# Patient Record
Sex: Female | Born: 1970
Health system: Southern US, Community
[De-identification: ages and names within clinical notes are randomized; demographics above are authoritative.]

## PROBLEM LIST (undated history)

## (undated) DIAGNOSIS — H9190 Unspecified hearing loss, unspecified ear: Secondary | ICD-10-CM

## (undated) DIAGNOSIS — Z975 Presence of (intrauterine) contraceptive device: Secondary | ICD-10-CM

## (undated) DIAGNOSIS — T8859XA Other complications of anesthesia, initial encounter: Secondary | ICD-10-CM

## (undated) DIAGNOSIS — K219 Gastro-esophageal reflux disease without esophagitis: Secondary | ICD-10-CM

## (undated) DIAGNOSIS — G5602 Carpal tunnel syndrome, left upper limb: Secondary | ICD-10-CM

## (undated) DIAGNOSIS — Z8639 Personal history of other endocrine, nutritional and metabolic disease: Secondary | ICD-10-CM

## (undated) DIAGNOSIS — T4145XA Adverse effect of unspecified anesthetic, initial encounter: Secondary | ICD-10-CM

## (undated) DIAGNOSIS — F411 Generalized anxiety disorder: Secondary | ICD-10-CM

## (undated) DIAGNOSIS — L709 Acne, unspecified: Secondary | ICD-10-CM

## (undated) DIAGNOSIS — Z8679 Personal history of other diseases of the circulatory system: Secondary | ICD-10-CM

## (undated) HISTORY — DX: Personal history of other diseases of the circulatory system: Z86.79

## (undated) HISTORY — DX: Carpal tunnel syndrome, left upper limb: G56.02

## (undated) HISTORY — DX: Generalized anxiety disorder: F41.1

## (undated) HISTORY — DX: Acne, unspecified: L70.9

## (undated) HISTORY — DX: Personal history of other endocrine, nutritional and metabolic disease: Z86.39

## (undated) HISTORY — DX: Presence of (intrauterine) contraceptive device: Z97.5

## (undated) HISTORY — DX: Gastro-esophageal reflux disease without esophagitis: K21.9

## (undated) HISTORY — PX: MANDIBLE SURGERY: SHX707

## (undated) HISTORY — DX: Unspecified hearing loss, unspecified ear: H91.90

---

## 1998-08-04 ENCOUNTER — Ambulatory Visit (HOSPITAL_BASED_OUTPATIENT_CLINIC_OR_DEPARTMENT_OTHER): Admission: RE | Admit: 1998-08-04 | Discharge: 1998-08-04 | Payer: Self-pay | Admitting: Otolaryngology

## 1998-09-29 ENCOUNTER — Other Ambulatory Visit: Admission: RE | Admit: 1998-09-29 | Discharge: 1998-09-29 | Payer: Self-pay | Admitting: Obstetrics and Gynecology

## 1999-08-03 ENCOUNTER — Ambulatory Visit (HOSPITAL_BASED_OUTPATIENT_CLINIC_OR_DEPARTMENT_OTHER): Admission: RE | Admit: 1999-08-03 | Discharge: 1999-08-03 | Payer: Self-pay | Admitting: Otolaryngology

## 1999-09-19 ENCOUNTER — Other Ambulatory Visit: Admission: RE | Admit: 1999-09-19 | Discharge: 1999-09-19 | Payer: Self-pay | Admitting: Obstetrics and Gynecology

## 2000-05-22 ENCOUNTER — Other Ambulatory Visit: Admission: RE | Admit: 2000-05-22 | Discharge: 2000-05-22 | Payer: Self-pay | Admitting: Obstetrics and Gynecology

## 2000-12-13 ENCOUNTER — Inpatient Hospital Stay (HOSPITAL_COMMUNITY): Admission: AD | Admit: 2000-12-13 | Discharge: 2000-12-16 | Payer: Self-pay | Admitting: *Deleted

## 2000-12-17 ENCOUNTER — Encounter: Admission: RE | Admit: 2000-12-17 | Discharge: 2001-03-17 | Payer: Self-pay | Admitting: Obstetrics and Gynecology

## 2001-01-15 ENCOUNTER — Other Ambulatory Visit: Admission: RE | Admit: 2001-01-15 | Discharge: 2001-01-15 | Payer: Self-pay | Admitting: Obstetrics and Gynecology

## 2001-01-22 ENCOUNTER — Inpatient Hospital Stay (HOSPITAL_COMMUNITY): Admission: AD | Admit: 2001-01-22 | Discharge: 2001-01-22 | Payer: Self-pay | Admitting: Obstetrics and Gynecology

## 2001-02-25 ENCOUNTER — Encounter: Payer: Self-pay | Admitting: Urology

## 2001-02-25 ENCOUNTER — Encounter: Admission: RE | Admit: 2001-02-25 | Discharge: 2001-02-25 | Payer: Self-pay | Admitting: Urology

## 2001-02-26 ENCOUNTER — Encounter: Admission: RE | Admit: 2001-02-26 | Discharge: 2001-02-26 | Payer: Self-pay | Admitting: Urology

## 2001-02-26 ENCOUNTER — Encounter: Payer: Self-pay | Admitting: Urology

## 2002-01-18 ENCOUNTER — Other Ambulatory Visit: Admission: RE | Admit: 2002-01-18 | Discharge: 2002-01-18 | Payer: Self-pay | Admitting: Obstetrics and Gynecology

## 2003-01-24 ENCOUNTER — Other Ambulatory Visit: Admission: RE | Admit: 2003-01-24 | Discharge: 2003-01-24 | Payer: Self-pay | Admitting: Obstetrics and Gynecology

## 2003-12-15 ENCOUNTER — Inpatient Hospital Stay (HOSPITAL_COMMUNITY): Admission: RE | Admit: 2003-12-15 | Discharge: 2003-12-17 | Payer: Self-pay | Admitting: Obstetrics and Gynecology

## 2004-01-23 ENCOUNTER — Other Ambulatory Visit: Admission: RE | Admit: 2004-01-23 | Discharge: 2004-01-23 | Payer: Self-pay | Admitting: Obstetrics and Gynecology

## 2005-03-18 ENCOUNTER — Other Ambulatory Visit: Admission: RE | Admit: 2005-03-18 | Discharge: 2005-03-18 | Payer: Self-pay | Admitting: Obstetrics and Gynecology

## 2005-06-11 ENCOUNTER — Encounter: Admission: RE | Admit: 2005-06-11 | Discharge: 2005-06-11 | Payer: Self-pay | Admitting: Obstetrics and Gynecology

## 2006-01-02 ENCOUNTER — Encounter: Admission: RE | Admit: 2006-01-02 | Discharge: 2006-01-02 | Payer: Self-pay | Admitting: Obstetrics and Gynecology

## 2006-06-17 ENCOUNTER — Encounter: Admission: RE | Admit: 2006-06-17 | Discharge: 2006-06-17 | Payer: Self-pay | Admitting: Obstetrics and Gynecology

## 2008-08-24 ENCOUNTER — Ambulatory Visit: Payer: Self-pay | Admitting: *Deleted

## 2009-08-14 ENCOUNTER — Emergency Department (HOSPITAL_COMMUNITY): Admission: EM | Admit: 2009-08-14 | Discharge: 2009-08-14 | Payer: Self-pay | Admitting: Emergency Medicine

## 2010-08-21 ENCOUNTER — Encounter: Admission: RE | Admit: 2010-08-21 | Discharge: 2010-08-21 | Payer: Self-pay | Admitting: Family Medicine

## 2010-12-16 ENCOUNTER — Encounter: Payer: Self-pay | Admitting: Obstetrics and Gynecology

## 2011-04-08 DIAGNOSIS — F411 Generalized anxiety disorder: Secondary | ICD-10-CM | POA: Insufficient documentation

## 2011-04-12 NOTE — H&P (Signed)
NAMEYVONDA, FOUTY                        ACCOUNT NO.:  [id number]   MEDICAL RECORD NO.:  [id number]                   PATIENT TYPE:   LOCATION:                                       FACILITY:  [facility]   PHYSICIAN:  Dineen Kid. Rana Snare, M.D.                 DATE OF BIRTH:  Dec 07, 1970   DATE OF ADMISSION:  12/15/2003  DATE OF DISCHARGE:                                HISTORY & PHYSICAL   HISTORY OF PRESENT ILLNESS:  Ms. Amara is a 40 year old, G3, P1, at [redacted]  weeks gestational age, who presents for repeat cesarean section.  Her  estimated date of confinement is December 25, 2003.  Her pregnancy has been  complicated by previous cesarean section with an estimated fetal weight at  36 weeks of 6 pounds 7 ounces.  Her previous surgery was significant for a 7  pound 7 ounce baby with failure to progress and arrest of descent.  Because  the weight will be very similar, she desires repeat cesarean section.  She  also does have a history of fibroids as well.  Her group B Streptococcus was  negative.   PAST MEDICAL HISTORY:  Negative.   PAST SURGICAL HISTORY:  1. Cesarean section in 2002.  2. Surgery on her ears for otosclerosis.   MEDICATIONS:  Prenatal vitamins.   DRUG ALLERGIES:  She is allergic to:  1. KEFLEX.  2. ERYTHROMYCIN.  3. TETRACYCLINE.   PHYSICAL EXAMINATION:  VITAL SIGNS:  The blood pressure is 110/60.  HEART:  Regular rate and rhythm.  LUNGS:  Clear to auscultation bilaterally.  ABDOMEN:  Gravid.  Fundal height is 37 cm.  PELVIC:  The cervix is closed, 25%, and -2 station.   IMPRESSION:  1. Intrauterine pregnancy at term.  2. Previous cesarean section for arrest of descent with a similar size baby     this pregnancy.   PLAN:  Repeat cesarean section.  All risks and benefits were discussed at  length, which include, but not limited to risk of infection, bleeding,  damage to uterus, tubes, ovaries, bowel or bladder, and injury to fetus.  She does give her informed  consent.                                               Dineen Kid Rana Snare, M.D.    DCL/MEDQ  D:  12/14/2003  T:  12/14/2003  Job:  [id number]

## 2011-04-12 NOTE — Discharge Summary (Signed)
Helen Lowe, Helen Lowe                        ACCOUNT NO.:  [id number]   MEDICAL RECORD NO.:  [id number]                   PATIENT TYPE:  INP   LOCATION:  9105                                 FACILITY:  [facility]   PHYSICIAN:  Dineen Kid. Rana Snare, M.D.                 DATE OF BIRTH:  12/25/1970   DATE OF ADMISSION:  12/15/2003  DATE OF DISCHARGE:  12/17/2003                                 DISCHARGE SUMMARY   ADMITTING DIAGNOSES:  1. Intrauterine pregnancy at 42 weeks estimated gestational age.  2. Previous cesarean section, desires repeat.   DISCHARGE DIAGNOSES:  1. Status post low transverse cesarean section.  2. Viable female infant.   PROCEDURE:  Repeat low transverse cesarean section.   REASON FOR ADMISSION:  Please see dictated H&P.   HOSPITAL COURSE:  The patient was a 40 year old gravida 3 para 1 that was  admitted to [facility] at 39 weeks estimated gestational  age.  The patient's pregnancy had been complicated by a previous cesarean  section for a 7-pound 7-ounce baby due to arrest of descent.  Ultrasound at  36 weeks had shown the current baby was weighing 6 pounds 7 ounces so she  desired a repeat cesarean and presented to the hospital for that scheduled  cesarean delivery.  The patient was then transferred to the operating room  where spinal anesthesia was administered without difficulty.  A low  transverse incision was made with the delivery of a viable female infant  weighing 6 pounds 13 ounces with Apgars of 9 at one minute, 9 at five  minutes.  Umbilical cord pH was 7.36.  The patient tolerated the procedure  well and was taken to the recovery room in stable condition.  On  postoperative day #1 the patient was without complaint; however, she was  concerned about her previous history of a urinary tract infection after her  previous cesarean delivery with subsequent year-long follow-up with  urologist.  Vital signs were stable; she was afebrile.  Abdomen  was soft and  fundus was firm and nontender  The patient had good return of bowel  function.  Abdominal dressing had been partially removed revealing an  incision that was clean, dry, and intact.  Labs revealed hemoglobin 11.3;  platelet count 167,000; wbc count of 9.7.  The patient was started on some  empiric nitrofurantoin.  On postoperative day #2 the patient was doing well.  She was tolerating a regular diet without complaints of nausea and vomiting.  She was ambulating well and desired early discharge.  Instructions were  reviewed and the patient was discharged home.   CONDITION ON DISCHARGE:  Good.   DIET:  Regular as tolerated.   ACTIVITY:  No heavy lifting, no driving x2 weeks, no vaginal entry.   FOLLOW-UP:  The patient is to follow up in the office in 2-3 days for  removal of her staples.  Otherwise,  she is return to the office in  approximately 1 week for an incision check.  She is to call for temperature  greater than 100 degrees, persistent nausea and vomiting, heavy vaginal  bleeding, and/or redness or drainage from the incisional site.   DISCHARGE MEDICATIONS:  1. Tylox #30 one p.o. q.4-6h. p.r.n. pain.  2. Motrin 800 mg q.8h.  3. Macrobid #14 one p.o. b.i.d.  4. Prenatal vitamins one p.o. daily.     Julio Sicks, N.P.                        Dineen Kid Rana Snare, M.D.    CC/MEDQ  D:  01/13/2004  T:  01/14/2004  Job:  [id number]

## 2011-04-12 NOTE — Discharge Summary (Signed)
[facility]  Patient:    Helen Lowe, Helen Lowe                     MRN: [id number] Adm. Date:  [id number] Disc. Date: [id number] Attending:  Marina Gravel B                           Discharge Summary  ADMISSION DIAGNOSES:          1. Term intrauterine pregnancy, spontaneous                                  labor.                               2. Failure to descend.  PROCEDURES:                   Primary low transverse cesarean section.  HISTORY OF PRESENT ILLNESS:   For complete details, please see the history and physical on the chart. Briefly, the patient presented to [facility] on December 13, 2000 with complaints of painful contractions for approximately two and a half hours. At admission, she was 8 cm dilated, completely effaced, and 0 station.  HOSPITAL COURSE:              The patient was admitted to labor and delivery with artificial rupture of membranes performed. The patient progressed to complete/complete, +3 and pushed for approximately 90 minutes and had complaints of exhaustion. The patient had demonstrated no descent after pushing for 90 minutes. Clinical suspicion was for macrosomia with estimated fetal weight of approximately 8 pounds by Leopolds maneuvers. Given the poor progress through labor, options were discussed to include continued pushing versus primary C-section. The patient chose primary C-section.  On December 13, 2000 the patient underwent a primary low transverse cesarean section, a viable female infant with Apgars 9 and 9, weight 7 pounds 7 ounces, delivered. No complications.  Postoperatively, the patient rapidly regained her ability to ambulate, void, and tolerate a regular diet. She was discharged to home on the third postoperative day in satisfactory condition. She was noted to be A positive and rubella immune. Her postoperative hemoglobin was 11.0.  DISCHARGE INSTRUCTIONS:       Standard preprinted  instructions were given prior to dismissal.  DISCHARGE MEDICATIONS:        Tylox one to two tabs every four to six hours p.r.n. for pain.  DISCHARGE FOLLOWUP:           The patient is to follow up in four to six weeks at [facility] for a routine postpartum check.  CONDITION ON DISCHARGE:       Satisfactory. DD:  01/08/01 TD:  01/09/01 Job: [id number]

## 2011-04-12 NOTE — Op Note (Signed)
[facility]  Patient:    Helen Lowe, Helen Lowe                     MRN: [id number] Adm. Date:  [id number] Attending:  Marina Gravel B                           Operative Report  PREOPERATIVE DIAGNOSES:       1. Term intrauterine pregnancy.                               2. Arrest of descent.                               3. Maternal exhaustion.                               4. Suspected macrosomia.  POSTOPERATIVE DIAGNOSES:      1. Term intrauterine pregnancy.                               2. Arrest of descent.                               3. Maternal exhaustion.                               4. Suspected macrosomia.  PROCEDURE:                    Primary low transverse cesarean section.  SURGEON:                      Marina Gravel, M.D.  ASSISTANT:                    Jamey Reas, M.D.  ANESTHESIA:                   Epidural.  FINDINGS:                     Viable female infant, Apgars 9 and 9, weight 7 pounds 7 ounces. Normal uterus, tubes, ovaries.  ESTIMATED BLOOD LOSS:         600 cc.  URINE OUTPUT:                 100 cc.  FLUIDS:                       1000 cc.  COMPLICATIONS:                None.  DRAINS:                       Foley.  DESCRIPTION OF PROCEDURE:     Patient was taken to the operating room with epidural anesthesia in place. She was prepped and draped in standard fashion with Foley catheter in the bladder.  A Pfannenstiel incision was made with a knife and carried sharp to the underlying fascia. The fascia was divided in the midline and the incision extended laterally with Mayo  scissors. Kocher clamps used to elevate the superior aspect of the incision and underlying rectus muscles were dissected off sharply. Repeated inferiorly in similar fashion.  The midline of the rectus muscle was identified, the underlying peritoneum elevated with hemostats, entered sharply with Metzenbaum scissors. The incision extended  superiorly and inferiorly sharply.  Bladder blade was inserted, the vesicouterine peritoneum identified and elevated, and bladder flap created with sharp and blunt technique. The bladder blade was reinserted and the uterine incision made in low transverse fashion with a knife.  The infants head was delivered, the nose and mouth suctioned with the bulb, and the remainder of the infant delivered without difficulty. The cord was clamped and cut and the infant handed off to the awaiting pediatricians. Ancef 2 g was given at cord clamp.  The placenta was removed manually, the uterus exteriorized and cleared of all clots and debris. The uterine incision was inspected and was free of extension.  Bladder blade was reinserted and the uterine incision closed in a running locked fashion with 0 Monocryl. Hemostasis obtained.  The uterus was reinserted into the abdomen and the pelvis irrigated with normal saline. The uterine incision and bladder flap were hemostatic. The subfascial space was inspected and was hemostatic.  The fascia was closed with 0 Vicryl in running stitch. The subcutaneous tissue was irrigated and made hemostatic with a Bovie. The skin was closed with staples.  The patient tolerated the procedure well and there were no complications. She was taken to the recovery room awake, alert, and in stable condition. All counts were correct per the operating room staff. DD:  12/13/00 TD:  12/13/00 Job: [id number]

## 2011-04-12 NOTE — Op Note (Signed)
Helen Lowe, Helen Lowe                        ACCOUNT NO.:  [id number]   MEDICAL RECORD NO.:  [id number]                   PATIENT TYPE:  INP   LOCATION:  9105                                 FACILITY:  [facility]   PHYSICIAN:  Dineen Kid. Rana Snare, M.D.                 DATE OF BIRTH:  Mar 28, 1971   DATE OF PROCEDURE:  12/15/2003  DATE OF DISCHARGE:                                 OPERATIVE REPORT   PREOPERATIVE DIAGNOSES:  1. Intrauterine pregnancy at 39 weeks.  2. Previous cesarean section for arrest of descent and a similar-sized baby,     so she presents for repeat cesarean section.   POSTOPERATIVE DIAGNOSES:  1. Intrauterine pregnancy at 39 weeks.  2. Previous cesarean section for arrest of descent and a similar-sized baby,     so she presents for repeat cesarean section.   PROCEDURE:  Repeat low segment transverse cesarean section.   SURGEON:  Dineen Kid. Rana Snare, M.D.   ANESTHESIA:  Spinal.   INDICATIONS:  Ms. Rexroad is a 40 year old G3, P1, at 24 weeks, with an EDC  of December 15, 2003.  The pregnancy has been complicated by previous  cesarean section for a 7 pound 7 ounce baby due to arrest of descent.  Ultrasound at 36 weeks shows a 6 pound 7 ounce baby, so she does desire  repeat cesarean section and presents for that today.  Risks and benefits  were discussed and informed consent was obtained.   Findings at the time of surgery:  A viable female infant, Apgars are 9 and  9, pH arterial 7.36, weight 6 pounds 13 ounces.   DESCRIPTION OF PROCEDURE:  After adequate analgesia, the patient placed in  the supine position with a left lateral tilt.  She is sterilely prepped and  draped, the bladder was sterilely drained with a Foley catheter.  The  previous Pfannenstiel skin incision was sharply excised.  The incision was  then taken down sharply to the fascia, incised transversely and extended  superiorly and inferiorly.  After the bellies of the rectus muscle had been  separated sharply in  the midline, the peritoneum was entered sharply, the  bladder flap was created and placed behind the bladder blade.  A low segment  myotomy incision was made down to the infant's vertex and extended laterally  with the operator's fingertips.  The infant was then delivered  atraumatically, the nose and pharynx suctioned, cord clamped and cut, and  handed to the pediatricians.  The placenta was extracted manually.  The  uterus was exteriorized, wiped clean with a dry lap.  The myotomy incision  was closed with two layers using a 0 Monocryl, the first being a running  locking layer, the second being an imbricating layer, and then a figure-of-  eight o 0 Monocryl for hemostasis.  The uterus was placed back in the  peritoneal cavity and after a copious amount of irrigation,  adequate  hemostasis was assured.  The peritoneum was closed with 0 Monocryl suture,  rectus muscle plicated in the midline with 0 Monocryl suture.  The fascia  was then closed with a single suture of #1 Vicryl in a running fashion.  Irrigation was applied.  After adequate hemostasis, the Camper's fascia was  reapproximated using 2-0 plain suture.  The skin was then approximated with  staples and Steri-Strips with good approximation and good hemostasis.  The  patient tolerated the procedure well, was stable on transfer to recovery,  and the sponge, needle, and instrument count was normal x3.  Estimated blood  loss is 600 mL.  The patient received 900 mg of Clindamycin after delivery  of the placenta.                                               Dineen Kid Rana Snare, M.D.    DCL/MEDQ  D:  12/15/2003  T:  12/15/2003  Job:  [id number]

## 2011-12-27 ENCOUNTER — Other Ambulatory Visit: Payer: Self-pay | Admitting: Family Medicine

## 2011-12-27 DIAGNOSIS — Z1231 Encounter for screening mammogram for malignant neoplasm of breast: Secondary | ICD-10-CM

## 2011-12-30 ENCOUNTER — Ambulatory Visit: Payer: Self-pay

## 2012-01-01 ENCOUNTER — Ambulatory Visit
Admission: RE | Admit: 2012-01-01 | Discharge: 2012-01-01 | Disposition: A | Discharge status: Home / Self Care | Payer: BC Managed Care – PPO | Source: Ambulatory Visit | Attending: Family Medicine | Admitting: Family Medicine

## 2012-01-01 DIAGNOSIS — Z1231 Encounter for screening mammogram for malignant neoplasm of breast: Secondary | ICD-10-CM

## 2012-09-24 DIAGNOSIS — G5602 Carpal tunnel syndrome, left upper limb: Secondary | ICD-10-CM | POA: Insufficient documentation

## 2012-10-05 ENCOUNTER — Other Ambulatory Visit: Payer: Self-pay | Admitting: Family Medicine

## 2012-10-05 DIAGNOSIS — R609 Edema, unspecified: Secondary | ICD-10-CM

## 2012-10-09 ENCOUNTER — Ambulatory Visit
Admission: RE | Admit: 2012-10-09 | Discharge: 2012-10-09 | Disposition: A | Discharge status: Home / Self Care | Payer: BC Managed Care – PPO | Source: Ambulatory Visit | Attending: Family Medicine | Admitting: Family Medicine

## 2012-10-09 DIAGNOSIS — R609 Edema, unspecified: Secondary | ICD-10-CM

## 2012-10-09 MED ORDER — IOHEXOL 300 MG/ML  SOLN
75.0000 mL | Freq: Once | INTRAMUSCULAR | Status: AC | PRN
  Administered 2012-10-09: 75 mL via INTRAVENOUS

## 2012-10-12 ENCOUNTER — Other Ambulatory Visit: Payer: Self-pay | Admitting: Family Medicine

## 2012-10-12 DIAGNOSIS — E041 Nontoxic single thyroid nodule: Secondary | ICD-10-CM

## 2012-10-13 ENCOUNTER — Other Ambulatory Visit: Payer: Self-pay | Admitting: Family Medicine

## 2012-10-13 DIAGNOSIS — E041 Nontoxic single thyroid nodule: Secondary | ICD-10-CM

## 2012-10-14 ENCOUNTER — Other Ambulatory Visit (HOSPITAL_COMMUNITY)
Admission: RE | Admit: 2012-10-14 | Discharge: 2012-10-14 | Disposition: A | Discharge status: Home / Self Care | Payer: BC Managed Care – PPO | Source: Ambulatory Visit | Attending: Interventional Radiology | Admitting: Interventional Radiology

## 2012-10-14 ENCOUNTER — Ambulatory Visit
Admission: RE | Admit: 2012-10-14 | Discharge: 2012-10-14 | Disposition: A | Discharge status: Home / Self Care | Payer: BC Managed Care – PPO | Source: Ambulatory Visit | Attending: Family Medicine | Admitting: Family Medicine

## 2012-10-14 DIAGNOSIS — E041 Nontoxic single thyroid nodule: Secondary | ICD-10-CM

## 2012-10-14 DIAGNOSIS — E049 Nontoxic goiter, unspecified: Secondary | ICD-10-CM | POA: Insufficient documentation

## 2013-08-18 ENCOUNTER — Other Ambulatory Visit: Payer: Self-pay | Admitting: Family Medicine

## 2013-08-18 DIAGNOSIS — E049 Nontoxic goiter, unspecified: Secondary | ICD-10-CM | POA: Insufficient documentation

## 2013-08-18 DIAGNOSIS — Z1231 Encounter for screening mammogram for malignant neoplasm of breast: Secondary | ICD-10-CM

## 2013-08-18 LAB — HM PAP SMEAR

## 2013-08-20 ENCOUNTER — Ambulatory Visit: Payer: BC Managed Care – PPO

## 2013-09-06 ENCOUNTER — Ambulatory Visit
Admission: RE | Admit: 2013-09-06 | Discharge: 2013-09-06 | Disposition: A | Discharge status: Home / Self Care | Payer: BC Managed Care – PPO | Source: Ambulatory Visit | Attending: Family Medicine | Admitting: Family Medicine

## 2013-09-06 DIAGNOSIS — Z1231 Encounter for screening mammogram for malignant neoplasm of breast: Secondary | ICD-10-CM

## 2013-09-08 ENCOUNTER — Other Ambulatory Visit: Payer: Self-pay | Admitting: Family Medicine

## 2013-09-08 DIAGNOSIS — R928 Other abnormal and inconclusive findings on diagnostic imaging of breast: Secondary | ICD-10-CM

## 2013-09-17 ENCOUNTER — Ambulatory Visit
Admission: RE | Admit: 2013-09-17 | Discharge: 2013-09-17 | Disposition: A | Discharge status: Home / Self Care | Payer: BC Managed Care – PPO | Source: Ambulatory Visit | Attending: Family Medicine | Admitting: Family Medicine

## 2013-09-17 DIAGNOSIS — R928 Other abnormal and inconclusive findings on diagnostic imaging of breast: Secondary | ICD-10-CM

## 2013-09-20 ENCOUNTER — Other Ambulatory Visit: Payer: BC Managed Care – PPO

## 2013-09-22 ENCOUNTER — Other Ambulatory Visit: Payer: BC Managed Care – PPO

## 2015-02-03 ENCOUNTER — Other Ambulatory Visit: Payer: Self-pay | Admitting: Obstetrics and Gynecology

## 2015-02-06 LAB — CYTOLOGY - PAP

## 2015-03-10 ENCOUNTER — Other Ambulatory Visit: Payer: Self-pay | Admitting: Obstetrics and Gynecology

## 2015-03-10 DIAGNOSIS — R928 Other abnormal and inconclusive findings on diagnostic imaging of breast: Secondary | ICD-10-CM

## 2015-03-16 ENCOUNTER — Ambulatory Visit
Admission: RE | Admit: 2015-03-16 | Discharge: 2015-03-16 | Disposition: A | Discharge status: Home / Self Care | Payer: 59 | Source: Ambulatory Visit | Attending: Obstetrics and Gynecology | Admitting: Obstetrics and Gynecology

## 2015-03-16 DIAGNOSIS — R928 Other abnormal and inconclusive findings on diagnostic imaging of breast: Secondary | ICD-10-CM

## 2015-10-30 ENCOUNTER — Other Ambulatory Visit: Payer: Self-pay

## 2015-10-30 DIAGNOSIS — N63 Unspecified lump in unspecified breast: Secondary | ICD-10-CM

## 2015-10-31 ENCOUNTER — Other Ambulatory Visit: Payer: Self-pay | Admitting: Obstetrics and Gynecology

## 2015-10-31 DIAGNOSIS — N6002 Solitary cyst of left breast: Secondary | ICD-10-CM

## 2015-11-06 ENCOUNTER — Other Ambulatory Visit: Payer: 59

## 2015-11-09 ENCOUNTER — Other Ambulatory Visit: Payer: Self-pay | Admitting: Obstetrics and Gynecology

## 2015-11-09 ENCOUNTER — Ambulatory Visit
Admission: RE | Admit: 2015-11-09 | Discharge: 2015-11-09 | Disposition: A | Discharge status: Home / Self Care | Payer: 59 | Source: Ambulatory Visit | Attending: Obstetrics and Gynecology | Admitting: Obstetrics and Gynecology

## 2015-11-09 DIAGNOSIS — N6002 Solitary cyst of left breast: Secondary | ICD-10-CM

## 2015-11-09 HISTORY — PX: BREAST CYST ASPIRATION: SHX578

## 2016-03-29 ENCOUNTER — Other Ambulatory Visit: Payer: Self-pay

## 2016-03-29 DIAGNOSIS — Z1231 Encounter for screening mammogram for malignant neoplasm of breast: Secondary | ICD-10-CM

## 2016-04-23 ENCOUNTER — Ambulatory Visit
Admission: RE | Admit: 2016-04-23 | Discharge: 2016-04-23 | Disposition: A | Discharge status: Home / Self Care | Payer: 59 | Source: Ambulatory Visit

## 2016-04-23 DIAGNOSIS — Z1231 Encounter for screening mammogram for malignant neoplasm of breast: Secondary | ICD-10-CM

## 2017-04-22 ENCOUNTER — Other Ambulatory Visit: Payer: Self-pay | Admitting: Nurse Practitioner

## 2017-04-22 DIAGNOSIS — Z1231 Encounter for screening mammogram for malignant neoplasm of breast: Secondary | ICD-10-CM

## 2017-04-25 ENCOUNTER — Ambulatory Visit
Admission: RE | Admit: 2017-04-25 | Discharge: 2017-04-25 | Disposition: A | Discharge status: Home / Self Care | Payer: 59 | Source: Ambulatory Visit | Attending: Nurse Practitioner | Admitting: Nurse Practitioner

## 2017-04-25 DIAGNOSIS — Z1231 Encounter for screening mammogram for malignant neoplasm of breast: Secondary | ICD-10-CM

## 2018-01-06 ENCOUNTER — Ambulatory Visit (INDEPENDENT_AMBULATORY_CARE_PROVIDER_SITE_OTHER): Payer: 59 | Admitting: Family Medicine

## 2018-01-06 ENCOUNTER — Encounter: Payer: Self-pay | Admitting: Family Medicine

## 2018-01-06 VITALS — BP 110/64 | HR 64 | Ht 63.0 in | Wt 163.0 lb

## 2018-01-06 DIAGNOSIS — H9193 Unspecified hearing loss, bilateral: Secondary | ICD-10-CM

## 2018-01-06 DIAGNOSIS — K219 Gastro-esophageal reflux disease without esophagitis: Secondary | ICD-10-CM | POA: Diagnosis not present

## 2018-01-06 DIAGNOSIS — H8093 Unspecified otosclerosis, bilateral: Secondary | ICD-10-CM

## 2018-01-06 DIAGNOSIS — Z8639 Personal history of other endocrine, nutritional and metabolic disease: Secondary | ICD-10-CM | POA: Diagnosis not present

## 2018-01-06 DIAGNOSIS — Z8679 Personal history of other diseases of the circulatory system: Secondary | ICD-10-CM | POA: Insufficient documentation

## 2018-01-06 DIAGNOSIS — F411 Generalized anxiety disorder: Secondary | ICD-10-CM | POA: Diagnosis not present

## 2018-01-06 DIAGNOSIS — H9190 Unspecified hearing loss, unspecified ear: Secondary | ICD-10-CM | POA: Insufficient documentation

## 2018-01-06 DIAGNOSIS — H8090 Unspecified otosclerosis, unspecified ear: Secondary | ICD-10-CM | POA: Insufficient documentation

## 2018-01-06 NOTE — Progress Notes (Signed)
   Subjective:    Patient ID: Helen Lowe, female    DOB: [birth year], 47 y.o.   MRN: [id number]  HPI Chief Complaint  Patient presents with  . new pt    new pt get established. no other concerns   She is new to the practice and here to establish care.  [facility], Dr. Mardelle MatteAndy in past. Dr[facility] Du[facility] Guessewey at [facility].   Other providers:  Julio Sicksarol Curtis - [facility]. Retired.   Dr. Pollyann Kennedyosen- for otosclerosis - had surgery. Last saw him in 8-9 years ago.   GERD- reports taking omeprazole 40 mg daily for years. States she avoids foods that make this worse. Denies seeing GI in the past.   Anxiety- states she has been on citalopram and Wellbutrin for at least 5 years.  Is considering stopping these at some point. States she is feeling better than in the past. Thinks this is mostly because hot yoga and improved body and energy.   Thyroid nodule and goiter by biopsy - stable in 2014 per EMR.  Patient reports having a nodule biopsied on her right lateral neck.   Social history: Lives with her husband and son and daughter, works as a Agricultural engineerbenefit consultant  Denies smoking and drug use  Diet: fairly healthy  Excerise: hot yoga 4-5 times per week  Immunizations: Tdap 2009?  Health maintenance:  Mammogram: breast center June 2018 IUD - 2 years ago    Reviewed allergies, medications, past medical, surgical, family, and social history.   Review of Systems Pertinent positives and negatives in the history of present illness.     Objective:   Physical Exam BP 110/64   Pulse 64   Ht 5\' 3"  (1.6 m)   Wt 163 lb (73.9 kg)   BMI 28.87 kg/m   Alert and oriented and in no acute distress. Not otherwise examined.       Assessment & Plan:  Gastroesophageal reflux disease, esophagitis presence not specified  GAD (generalized anxiety disorder)  History of thyroid nodule  Otosclerosis of both ears  Bilateral hearing loss, unspecified hearing loss type  H/O  lymphadenitis  Plans to try and cut back on omeprazole dosing. Will try a week or so of this and  will let me know if she would like for me to reduce her omeprazole from 40 mg to 20 mg.  Anxiety - doing well on current medication regimen. Advised to wait until the spring if she is serious about weaning off medications.  Per EMR- history of lymphadenitis on right lateral neck.  We will follow up on thyroid at her upcoming CPE.

## 2018-01-06 NOTE — Patient Instructions (Signed)
Try to cut back on omeprazole and manage your reflux with lifestyle modifications.  Return for a fasting physical at your convenience.    Gastroesophageal Reflux Disease, Adult Normally, food travels down the esophagus and stays in the stomach to be digested. If a person has gastroesophageal reflux disease (GERD), food and stomach acid move back up into the esophagus. When this happens, the esophagus becomes sore and swollen (inflamed). Over time, GERD can make small holes (ulcers) in the lining of the esophagus. Follow these instructions at home: Diet  Follow a diet as told by your doctor. You may need to avoid foods and drinks such as: ? Coffee and tea (with or without caffeine). ? Drinks that contain alcohol. ? Energy drinks and sports drinks. ? Carbonated drinks or sodas. ? Chocolate and cocoa. ? Peppermint and mint flavorings. ? Garlic and onions. ? Horseradish. ? Spicy and acidic foods, such as peppers, chili powder, curry powder, vinegar, hot sauces, and BBQ sauce. ? Citrus fruit juices and citrus fruits, such as oranges, lemons, and limes. ? Tomato-based foods, such as red sauce, chili, salsa, and pizza with red sauce. ? Fried and fatty foods, such as donuts, french fries, potato chips, and high-fat dressings. ? High-fat meats, such as hot dogs, rib eye steak, sausage, ham, and bacon. ? High-fat dairy items, such as whole milk, butter, and cream cheese.  Eat small meals often. Avoid eating large meals.  Avoid drinking large amounts of liquid with your meals.  Avoid eating meals during the 2-3 hours before bedtime.  Avoid lying down right after you eat.  Do not exercise right after you eat. General instructions  Pay attention to any changes in your symptoms.  Take over-the-counter and prescription medicines only as told by your doctor. Do not take aspirin, ibuprofen, or other NSAIDs unless your doctor says it is okay.  Do not use any tobacco products, including  cigarettes, chewing tobacco, and e-cigarettes. If you need help quitting, ask your doctor.  Wear loose clothes. Do not wear anything tight around your waist.  Raise (elevate) the head of your bed about 6 inches (15 cm).  Try to lower your stress. If you need help doing this, ask your doctor.  If you are overweight, lose an amount of weight that is healthy for you. Ask your doctor about a safe weight loss goal.  Keep all follow-up visits as told by your doctor. This is important. Contact a doctor if:  You have new symptoms.  You lose weight and you do not know why it is happening.  You have trouble swallowing, or it hurts to swallow.  You have wheezing or a cough that keeps happening.  Your symptoms do not get better with treatment.  You have a hoarse voice. Get help right away if:  You have pain in your arms, neck, jaw, teeth, or back.  You feel sweaty, dizzy, or light-headed.  You have chest pain or shortness of breath.  You throw up (vomit) and your throw up looks like blood or coffee grounds.  You pass out (faint).  Your poop (stool) is bloody or black.  You cannot swallow, drink, or eat. This information is not intended to replace advice given to you by your health care provider. Make sure you discuss any questions you have with your health care provider. Document Released: 04/29/2008 Document Revised: 04/18/2016 Document Reviewed: 03/08/2015 Elsevier Interactive Patient Education  [employer]2018 [employer]

## 2018-01-30 ENCOUNTER — Encounter: Payer: Self-pay | Admitting: Family Medicine

## 2018-01-30 MED ORDER — CITALOPRAM HYDROBROMIDE 10 MG PO TABS: 10.0000 mg | ORAL_TABLET | Freq: Every day | ORAL | 1 refills | Status: DC

## 2018-01-30 MED ORDER — BUPROPION HCL ER (XL) 150 MG PO TB24: 150.0000 mg | ORAL_TABLET | Freq: Every day | ORAL | 1 refills | Status: DC

## 2018-01-30 MED ORDER — OMEPRAZOLE 40 MG PO CPDR: 40.0000 mg | DELAYED_RELEASE_CAPSULE | Freq: Every day | ORAL | 1 refills | Status: DC

## 2018-04-24 ENCOUNTER — Ambulatory Visit: Payer: 59 | Admitting: Family Medicine

## 2018-04-27 ENCOUNTER — Other Ambulatory Visit: Payer: Self-pay

## 2018-04-27 ENCOUNTER — Ambulatory Visit (INDEPENDENT_AMBULATORY_CARE_PROVIDER_SITE_OTHER): Payer: 59 | Admitting: Family Medicine

## 2018-04-27 ENCOUNTER — Encounter: Payer: Self-pay | Admitting: Family Medicine

## 2018-04-27 VITALS — BP 138/80 | HR 63 | Temp 98.6°F | Ht 62.0 in | Wt 162.6 lb

## 2018-04-27 DIAGNOSIS — E663 Overweight: Secondary | ICD-10-CM | POA: Diagnosis not present

## 2018-04-27 DIAGNOSIS — F411 Generalized anxiety disorder: Secondary | ICD-10-CM | POA: Diagnosis not present

## 2018-04-27 DIAGNOSIS — Z975 Presence of (intrauterine) contraceptive device: Secondary | ICD-10-CM | POA: Diagnosis not present

## 2018-04-27 NOTE — Patient Instructions (Signed)
Follow-up in 1 month for your Physical.  Check out the NOOM App, [employer] & [email or web address]   Heart-Healthy Eating Plan Heart-healthy meal planning includes:  Limiting unhealthy fats.  Increasing healthy fats.  Making other small dietary changes.  You may need to talk with your doctor or a diet specialist (dietitian) to create an eating plan that is right for you. What types of fat should I choose?  Choose healthy fats. These include olive oil and canola oil, flaxseeds, walnuts, almonds, and seeds.  Eat more omega-3 fats. These include salmon, mackerel, sardines, tuna, flaxseed oil, and ground flaxseeds. Try to eat fish at least twice each week.  Limit saturated fats. ? Saturated fats are often found in animal products, such as meats, butter, and cream. ? Plant sources of saturated fats include palm oil, palm kernel oil, and coconut oil.  Avoid foods with partially hydrogenated oils in them. These include stick margarine, some tub margarines, cookies, crackers, and other baked goods. These contain trans fats. What general guidelines do I need to follow?  Check food labels carefully. Identify foods with trans fats or high amounts of saturated fat.  Fill one half of your plate with vegetables and green salads. Eat 4-5 servings of vegetables per day. A serving of vegetables is: ? 1 cup of raw leafy vegetables. ?  cup of raw or cooked cut-up vegetables. ?  cup of vegetable juice.  Fill one fourth of your plate with whole grains. Look for the word "whole" as the first word in the ingredient list.  Fill one fourth of your plate with lean protein foods.  Eat 4-5 servings of fruit per day. A serving of fruit is: ? One medium whole fruit. ?  cup of dried fruit. ?  cup of fresh, frozen, or canned fruit. ?  cup of 100% fruit juice.  Eat more foods that contain soluble fiber. These include apples, broccoli, carrots, beans, peas, and barley. Try to get 20-30 g of fiber per  day.  Eat more home-cooked food. Eat less restaurant, buffet, and fast food.  Limit or avoid alcohol.  Limit foods high in starch and sugar.  Avoid fried foods.  Avoid frying your food. Try baking, boiling, grilling, or broiling it instead. You can also reduce fat by: ? Removing the skin from poultry. ? Removing all visible fats from meats. ? Skimming the fat off of stews, soups, and gravies before serving them. ? Steaming vegetables in water or broth.  Lose weight if you are overweight.  Eat 4-5 servings of nuts, legumes, and seeds per week: ? One serving of dried beans or legumes equals  cup after being cooked. ? One serving of nuts equals 1 ounces. ? One serving of seeds equals  ounce or one tablespoon.  You may need to keep track of how much salt or sodium you eat. This is especially true if you have high blood pressure. Talk with your doctor or dietitian to get more information. What foods can I eat? Grains Breads, including [place], white, pita, wheat, raisin, rye, oatmeal, and [place]. Tortillas that are neither fried nor made with lard or trans fat. Low-fat rolls, including hotdog and hamburger buns and English muffins. Biscuits. Muffins. Waffles. Pancakes. Light popcorn. Whole-grain cereals. Flatbread. Melba toast. Pretzels. Breadsticks. Rusks. Low-fat snacks. Low-fat crackers, including oyster, saltine, matzo, graham, animal, and rye. Rice and pasta, including brown rice and pastas that are made with whole wheat. Vegetables All vegetables. Fruits All fruits, but limit coconut. Meats and  Other Protein Sources Lean, well-trimmed beef, veal, pork, and lamb. Chicken and [place] without skin. All fish and shellfish. Wild duck, rabbit, pheasant, and venison. Egg whites or low-cholesterol egg substitutes. Dried beans, peas, lentils, and tofu. Seeds and most nuts. Dairy Low-fat or nonfat cheeses, including ricotta, string, and mozzarella. Skim or 1% milk that is liquid, powdered,  or evaporated. Buttermilk that is made with low-fat milk. Nonfat or low-fat yogurt. Beverages Mineral water. Diet carbonated beverages. Sweets and Desserts Sherbets and fruit ices. Honey, jam, marmalade, jelly, and syrups. Meringues and gelatins. Pure sugar candy, such as hard candy, jelly beans, gumdrops, mints, marshmallows, and small amounts of dark chocolate. MGM MIR[employer]el food cake. Eat all sweets and desserts in moderation. Fats and Oils Nonhydrogenated (trans-free) margarines. Vegetable oils, including soybean, sesame, sunflower, olive, peanut, safflower, corn, canola, and cottonseed. Salad dressings or mayonnaise made with a vegetable oil. Limit added fats and oils that you use for cooking, baking, salads, and as spreads. Other Cocoa powder. Coffee and tea. All seasonings and condiments. The items listed above may not be a complete list of recommended foods or beverages. Contact your dietitian for more options. What foods are not recommended? Grains Breads that are made with saturated or trans fats, oils, or whole milk. Croissants. Butter rolls. Cheese breads. Sweet rolls. Donuts. Buttered popcorn. Chow mein noodles. High-fat crackers, such as cheese or butter crackers. Meats and Other Protein Sources Fatty meats, such as hotdogs, short ribs, sausage, spareribs, bacon, rib eye roast or steak, and mutton. High-fat deli meats, such as salami and bologna. Caviar. Domestic duck and goose. Organ meats, such as kidney, liver, sweetbreads, and heart. Dairy Cream, sour cream, cream cheese, and creamed cottage cheese. Whole-milk cheeses, including blue (bleu), [address] [place], [place], [place], [address], [place], [address], cheddar, [place], and [place]. Whole or 2% milk that is liquid, evaporated, or condensed. Whole buttermilk. Cream sauce or high-fat cheese sauce. Yogurt that is made from whole milk. Beverages Regular sodas and juice drinks with added sugar. Sweets and Desserts Frosting. Pudding. Cookies.  Cakes other than angel food cake. Candy that has milk chocolate or white chocolate, hydrogenated fat, butter, coconut, or unknown ingredients. Buttered syrups. Full-fat ice cream or ice cream drinks. Fats and Oils Gravy that has suet, meat fat, or shortening. Cocoa butter, hydrogenated oils, palm oil, coconut oil, palm kernel oil. These can often be found in baked products, candy, fried foods, nondairy creamers, and whipped toppings. Solid fats and shortenings, including bacon fat, salt pork, lard, and butter. Nondairy cream substitutes, such as coffee creamers and sour cream substitutes. Salad dressings that are made of unknown oils, cheese, or sour cream. The items listed above may not be a complete list of foods and beverages to avoid. Contact your dietitian for more information. This information is not intended to replace advice given to you by your health care provider. Make sure you discuss any questions you have with your health care provider. Document Released: 05/12/2012 Document Revised: 04/18/2016 Document Reviewed: 05/05/2014 Elsevier Interactive Patient Education  [employer]2018 Elsevier Inc.

## 2018-04-27 NOTE — Progress Notes (Signed)
Subjective  CC:  Chief Complaint  Patient presents with  . Establish Care    [facility] 2014    HPI: Helen Lowe is a [age] y.o. female is a former [facility] patient and is here to reestablish care with me today.  However, I last saw her in 2015.  Since then she has been seeing her gynecologist regularly for annual exams and most recently, this year, saw a PA in another practice to establish care.  I reviewed that note.  I reviewed my old records.   She has the following concerns or needs:  Overall, she does well, however her main concern is weight management.  For the last 5 months she has changed the way she eats and has been exercising regularly with a strenuous form of hot yoga 3 times weekly.  She admits that she is stronger, has better endurance, and her close fit her better.  However she is disappointed that her weight has not changed significantly.  We reviewed her diet in detail: She tends to drink a protein shake for breakfast, has salad with a lean protein for lunch, and then cooks at home most of the time with a lean protein vegetable and starch.  She is not sure about her daily caloric intake.  As stated above, she exercises 3 times weekly.  She takes in about 3 to 4 glasses of wine weekly.  No sweetened beverages.  No significant dessert or snack intake.  She is on Celexa and has been for the last 5 years or so.  This has possibly contributed to her weight gain.  She also uses the Mirena IUD for birth control.  She denies history of hypothyroidism.  Energy levels are good.  No menstrual cycles currently on the IUD.  Her mood is good although she does live a busy life that can be stressful.  Assessment  1. Overweight (BMI 25.0-29.9)   2. IUD (intrauterine device) in place mirena   3. GAD (generalized anxiety disorder)      Plan   Today's visit was 40 minutes long. Greater than 50% of this time was devoted to face to face counseling with the patient  and coordination of care. We discussed her diagnosis, prognosis, treatment options and treatment plan is documented below.   We discussed nutrition and exercise counseling in detail.  Recommend starting to log her daily caloric intake.  Ensure she is getting enough calories and that they are balanced with fruits vegetables grains and lean proteins.  I also recommend changing up her exercise routine she has been doing the same thing for the last 6 months and this can make it difficult for weight loss.  General anxiety is doing much better on medications.  However we may try to wean off Celexa as this can be difficult for weight loss.  We will readdress this at her follow-up in 1 month for complete physical.  At that time we will do her blood work.  Other health maintenance is up-to-date.  Follow up:  Return in about 1 month (around 05/25/2018) for complete physical.  Orders Placed This Encounter  Procedures  . HM PAP SMEAR   No orders of the defined types were placed in this encounter.     We updated and reviewed the patient's past history in detail and it is documented below.  Patient Active Problem List   Diagnosis Date Noted  . GAD (generalized anxiety disorder) 04/08/2011    Priority: High  .  GERD (gastroesophageal reflux disease)     Priority: Medium  . Otosclerosis     Priority: Medium  . Hearing loss     Priority: Medium    due to otosclerosis   . Thyroid nodule 08/18/2013    Priority: Medium    Overview:  Goiter by biopsy 09/2012   . IUD (intrauterine device) in place mirena 04/27/2018    Priority: Low  . H/O lymphadenitis     Priority: Low    right lateral neck    Health Maintenance  Topic Date Due  . HIV Screening  04/28/2019 (Originally 08/22/1986)  . INFLUENZA VACCINE  06/25/2018  . TETANUS/TDAP  07/12/2018  . PAP SMEAR  03/25/2020   Immunization History  Administered Date(s) Administered  . Influenza Split 09/24/2012  . Influenza-Unspecified 12/06/2011,  08/18/2013  . Tdap 07/12/2008   Current Meds  Medication Sig  . buPROPion (WELLBUTRIN XL) 150 MG 24 hr tablet Take 1 tablet (150 mg total) by mouth daily.  . citalopram (CELEXA) 10 MG tablet Take 1 tablet (10 mg total) by mouth daily.  Marland Kitchen omeprazole (PRILOSEC) 40 MG capsule Take 1 capsule (40 mg total) by mouth daily.    Allergies: Patient is allergic to cephalexin; chlorhexidine gluconate; dicloxacillin; lidocaine; and tetracyclines & related. Past Medical History Patient  has a past medical history of Acne, Carpal tunnel syndrome of left wrist, GAD (generalized anxiety disorder), GERD (gastroesophageal reflux disease), H/O lymphadenitis, Hearing loss, History of thyroid nodule, and IUD contraception. Past Surgical History Patient  has a past surgical history that includes Breast cyst aspiration (Left, 11/09/2015); Cesarean section; and Mandible surgery. Family History: Patient family history includes Breast cancer in her maternal aunt; Glaucoma in her mother; Heart failure in her maternal grandmother; Skin cancer in her sister. Social History:  Patient  reports that she has never smoked. She has never used smokeless tobacco. She reports that she drinks alcohol. She reports that she does not use drugs.  Review of Systems: Constitutional: negative for fever or malaise Ophthalmic: negative for photophobia, double vision or loss of vision Cardiovascular: negative for chest pain, dyspnea on exertion, or new LE swelling Respiratory: negative for SOB or persistent cough Gastrointestinal: negative for abdominal pain, change in bowel habits or melena Genitourinary: negative for dysuria or gross hematuria Musculoskeletal: negative for new gait disturbance or muscular weakness Integumentary: negative for new or persistent rashes Neurological: negative for TIA or stroke symptoms Psychiatric: negative for SI or delusions Allergic/Immunologic: negative for hives  Patient Care Team     Relationship Specialty Notifications Start End  Willow Ora, MD PCP - General Family Medicine  04/27/18   Candice Camp, MD Consulting Physician Obstetrics and Gynecology  04/27/18     Objective  Vitals: BP 138/80   Pulse 63   Temp 98.6 F (37 C)   Ht 5\' 2"  (1.575 m)   Wt 162 lb 9.6 oz (73.8 kg)   BMI 29.74 kg/m  General:  Well developed, well nourished, no acute distress   Commons side effects, risks, benefits, and alternatives for medications and treatment plan prescribed today were discussed, and the patient expressed understanding of the given instructions. Patient is instructed to call or message via MyChart if he/she has any questions or concerns regarding our treatment plan. No barriers to understanding were identified. We discussed Red Flag symptoms and signs in detail. Patient expressed understanding regarding what to do in case of urgent or emergency type symptoms.   Medication list was reconciled, printed and provided to  the patient in AVS. Patient instructions and summary information was reviewed with the patient as documented in the AVS. This note was prepared with assistance of Dragon voice recognition software. Occasional wrong-word or sound-a-like substitutions may have occurred due to the inherent limitations of voice recognition software

## 2018-04-29 ENCOUNTER — Encounter: Payer: Self-pay | Admitting: Family Medicine

## 2018-05-18 ENCOUNTER — Encounter: Payer: Self-pay | Admitting: Family Medicine

## 2018-05-26 ENCOUNTER — Encounter: Payer: Self-pay | Admitting: Family Medicine

## 2018-05-26 ENCOUNTER — Other Ambulatory Visit: Payer: Self-pay

## 2018-05-26 ENCOUNTER — Ambulatory Visit (INDEPENDENT_AMBULATORY_CARE_PROVIDER_SITE_OTHER): Payer: 59 | Admitting: Family Medicine

## 2018-05-26 VITALS — BP 104/78 | HR 65 | Temp 98.3°F | Resp 15 | Ht 62.25 in | Wt 162.0 lb

## 2018-05-26 DIAGNOSIS — Z0000 Encounter for general adult medical examination without abnormal findings: Secondary | ICD-10-CM | POA: Diagnosis not present

## 2018-05-26 DIAGNOSIS — Z1239 Encounter for other screening for malignant neoplasm of breast: Secondary | ICD-10-CM

## 2018-05-26 DIAGNOSIS — Z1231 Encounter for screening mammogram for malignant neoplasm of breast: Secondary | ICD-10-CM

## 2018-05-26 DIAGNOSIS — E663 Overweight: Secondary | ICD-10-CM

## 2018-05-26 DIAGNOSIS — Z975 Presence of (intrauterine) contraceptive device: Secondary | ICD-10-CM | POA: Diagnosis not present

## 2018-05-26 DIAGNOSIS — F411 Generalized anxiety disorder: Secondary | ICD-10-CM | POA: Diagnosis not present

## 2018-05-26 DIAGNOSIS — E049 Nontoxic goiter, unspecified: Secondary | ICD-10-CM

## 2018-05-26 LAB — CBC WITH DIFFERENTIAL/PLATELET
BASOS ABS: 0 10*3/uL (ref 0.0–0.1)
Basophils Relative: 0.6 % (ref 0.0–3.0)
EOS ABS: 0.1 10*3/uL (ref 0.0–0.7)
Eosinophils Relative: 1.5 % (ref 0.0–5.0)
HCT: 45 % (ref 36.0–46.0)
Hemoglobin: 15.6 g/dL — ABNORMAL HIGH (ref 12.0–15.0)
LYMPHS ABS: 1.6 10*3/uL (ref 0.7–4.0)
LYMPHS PCT: 33.5 % (ref 12.0–46.0)
MCHC: 34.7 g/dL (ref 30.0–36.0)
MCV: 93.4 fl (ref 78.0–100.0)
MONOS PCT: 11.3 % (ref 3.0–12.0)
Monocytes Absolute: 0.5 10*3/uL (ref 0.1–1.0)
Neutro Abs: 2.6 10*3/uL (ref 1.4–7.7)
Neutrophils Relative %: 53.1 % (ref 43.0–77.0)
Platelets: 292 10*3/uL (ref 150.0–400.0)
RBC: 4.82 Mil/uL (ref 3.87–5.11)
RDW: 13.1 % (ref 11.5–15.5)
WBC: 4.8 10*3/uL (ref 4.0–10.5)

## 2018-05-26 LAB — POCT URINALYSIS DIPSTICK
Bilirubin, UA: NEGATIVE
Glucose, UA: NEGATIVE
Ketones, UA: NEGATIVE
LEUKOCYTES UA: NEGATIVE
NITRITE UA: NEGATIVE
PH UA: 6.5 (ref 5.0–8.0)
PROTEIN UA: NEGATIVE
RBC UA: NEGATIVE
SPEC GRAV UA: 1.015 (ref 1.010–1.025)
UROBILINOGEN UA: 0.2 U/dL

## 2018-05-26 LAB — COMPREHENSIVE METABOLIC PANEL
ALT: 24 U/L (ref 0–35)
AST: 19 U/L (ref 0–37)
Albumin: 4.5 g/dL (ref 3.5–5.2)
Alkaline Phosphatase: 66 U/L (ref 39–117)
BILIRUBIN TOTAL: 1.6 mg/dL — AB (ref 0.2–1.2)
BUN: 15 mg/dL (ref 6–23)
CO2: 29 meq/L (ref 19–32)
Calcium: 9.3 mg/dL (ref 8.4–10.5)
Chloride: 104 mEq/L (ref 96–112)
Creatinine, Ser: 0.7 mg/dL (ref 0.40–1.20)
GFR: 94.62 mL/min (ref >60.00)
GLUCOSE: 100 mg/dL — AB (ref 70–99)
Potassium: 4.5 mEq/L (ref 3.5–5.1)
SODIUM: 140 meq/L (ref 135–145)
Total Protein: 6.9 g/dL (ref 6.0–8.3)

## 2018-05-26 LAB — LIPID PANEL
CHOL/HDL RATIO: 3
Cholesterol: 243 mg/dL — ABNORMAL HIGH (ref 0–200)
HDL: 84.6 mg/dL (ref >39.00)
LDL Cholesterol: 148 mg/dL — ABNORMAL HIGH (ref 0–99)
NONHDL: 158.5
Triglycerides: 55 mg/dL (ref 0.0–149.0)
VLDL: 11 mg/dL (ref 0.0–40.0)

## 2018-05-26 LAB — TSH: TSH: 2.42 u[IU]/mL (ref 0.35–4.50)

## 2018-05-26 NOTE — Patient Instructions (Signed)
Please return in 4-6 weeks to recheck mood. Please sign a release of record from Dr. Vance GatherLowe's office for pap smears.   If you have any questions or concerns, please don't hesitate to send me a message via MyChart or call the office at [phone]. Thank you for visiting with us today! It's our pleasure caring for you.  I will release your lab results to you on your MyChart account with further instructions. Please reply with any questions.   We will call you with information regarding your referral appointment. [facility]  If you do not hear from us within the next 2 weeks, please let me know. It can take 1-2 weeks to get appointments set up with the specialists.   Please do these things to maintain good health!   Exercise at least 30-45 minutes a day,  4-5 days a week.   Eat a low-fat diet with lots of fruits and vegetables, up to 7-9 servings per day.  Drink plenty of water daily. Try to drink 8 8oz glasses per day.  Seatbelts can save your life. Always wear your seatbelt.  Place Smoke Detectors on every level of your home and check batteries every year.  Schedule an appointment with an eye doctor for an eye exam every 1-2 years  Safe sex - use condoms to protect yourself from STDs if you could be exposed to these types of infections. Use birth control if you do not want to become pregnant and are sexually active.  Avoid heavy alcohol use. If you drink, keep it to less than 2 drinks/day and not every day.  Health Care Power of Attorney.  Choose someone you trust that could speak for you if you became unable to speak for yourself.  Depression is common in our stressful world.If you're feeling down or losing interest in things you normally enjoy, please come in for a visit.  If anyone is threatening or hurting you, please get help. Physical or Emotional Violence is never OK.

## 2018-05-26 NOTE — Addendum Note (Signed)
Addended byDene Gentry: PETERMAN, AMY M on: 05/26/2018 09:58 AM   Modules accepted: Orders

## 2018-05-26 NOTE — Progress Notes (Signed)
Subjective  Chief Complaint  Patient presents with  . Annual Exam    Doing okay, has some questions about last visit, wants PAP today    HPI: Helen Lowe is a 47 y.o. female who presents to [facility] today for a Female Wellness Visit. Last pap 02/2016 at Dr. Vance Gather office; verified by telephone today.  Wellness Visit: annual visit with health maintenance review and exam without Pap   Doing well. Weaned down to 5mg  qod of celexa and doing ok. A little more anxiety has been noticed but manageable and has been in the setting of a hectic month.   Overweight: down a pound. Doing well on diet and is exercising regularly. Feels good about potential for further weight changes. Stopping celexa to see if that has contributed. Discussed IUD as a Chartered loss adjuster as well.   Mirena IUD placed 2017  HM: due for mammogram, BC.   Assessment  1. Annual physical exam   2. GAD (generalized anxiety disorder)   3. Overweight (BMI 25.0-29.9)   4. IUD (intrauterine device) in place mirena   5. Goiter   6. Breast cancer screening      Plan  Female Wellness Visit:  Age appropriate Health Maintenance and Prevention measures were discussed with patient. Included topics are cancer screening recommendations, ways to keep healthy (see AVS) including dietary and exercise recommendations, regular eye and dental care, use of seat belts, and avoidance of moderate alcohol use and tobacco use. Mammogram scheduled.   BMI: discussed patient's BMI and encouraged positive lifestyle modifications to help get to or maintain a target BMI.  HM needs and immunizations were addressed and ordered. See below for orders. See HM and immunization section for updates.  Routine labs and screening tests ordered including cmp, cbc and lipids where appropriate.  Discussed recommendations regarding Vit D and calcium supplementation (see AVS)  GAD: wean completely off celexa and recheck in  4-6 weeks; give time to see how mood/anxiety levels do on wellbutrin alone. Discussed options if anxiety is not controlled: increase wellbutrin dose or add lexapro.   Follow up: Return in about 6 weeks (around 07/07/2018) for mood follow up.   Orders Placed This Encounter  Procedures  . MM DIGITAL SCREENING BILATERAL  . Comprehensive metabolic panel  . Lipid panel  . TSH  . CBC with Differential/Platelet   No orders of the defined types were placed in this encounter.    Lifestyle: Body mass index is 29.39 kg/m. Wt Readings from Last 3 Encounters:  05/26/18 162 lb (73.5 kg)  04/27/18 162 lb 9.6 oz (73.8 kg)  01/06/18 163 lb (73.9 kg)    Patient Active Problem List   Diagnosis Date Noted  . GAD (generalized anxiety disorder) 04/08/2011    Priority: High  . GERD (gastroesophageal reflux disease)     Priority: Medium  . Otosclerosis     Priority: Medium  . Hearing loss     Priority: Medium    due to otosclerosis   . Goiter 08/18/2013    Priority: Medium    Overview:  Goiter by biopsy 09/2012   . IUD (intrauterine device) in place mirena 04/27/2018    Priority: Low  . H/O lymphadenitis     Priority: Low    right lateral neck    Health Maintenance  Topic Date Due  . MAMMOGRAM  04/25/2018  . HIV Screening  04/28/2019 (Originally 08/22/1986)  . INFLUENZA VACCINE  06/25/2018  . TETANUS/TDAP  07/12/2018  .  PAP SMEAR  02/24/2019   Immunization History  Administered Date(s) Administered  . Influenza Split 09/24/2012  . Influenza-Unspecified 12/06/2011, 08/18/2013  . Tdap 07/12/2008   We updated and reviewed the patient's past history in detail and it is documented below. Allergies: Patient is allergic to cephalexin; chlorhexidine gluconate; dicloxacillin; lidocaine; and tetracyclines & related. Past Medical History Patient  has a past medical history of Acne, Carpal tunnel syndrome of left wrist, GAD (generalized anxiety disorder), GERD (gastroesophageal reflux  disease), H/O lymphadenitis, Hearing loss, History of thyroid nodule, and IUD contraception. Past Surgical History Patient  has a past surgical history that includes Breast cyst aspiration (Left, 11/09/2015); Cesarean section; and Mandible surgery. Family History: Patient family history includes Breast cancer in her maternal aunt; Glaucoma in her mother; Heart failure in her maternal grandmother; Skin cancer in her sister. Social History:  Patient  reports that she has never smoked. She has never used smokeless tobacco. She reports that she drinks alcohol. She reports that she does not use drugs.  Review of Systems: Constitutional: negative for fever or malaise Ophthalmic: negative for photophobia, double vision or loss of vision Cardiovascular: negative for chest pain, dyspnea on exertion, or new LE swelling Respiratory: negative for SOB or persistent cough Gastrointestinal: negative for abdominal pain, change in bowel habits or melena Genitourinary: negative for dysuria or gross hematuria, no abnormal uterine bleeding or disharge Musculoskeletal: negative for new gait disturbance or muscular weakness Integumentary: negative for new or persistent rashes, no breast lumps Neurological: negative for TIA or stroke symptoms Psychiatric: negative for SI or delusions Allergic/Immunologic: negative for hives  Patient Care Team    Relationship Specialty Notifications Start End  Willow OraAndy, Camille L, MD PCP - General Family Medicine  04/27/18   Candice CampLowe, David, MD Consulting Physician Obstetrics and Gynecology  04/27/18    BP Readings from Last 3 Encounters:  05/26/18 104/78  04/27/18 138/80  01/06/18 110/64    Objective  Vitals: BP 104/78   Pulse 65   Temp 98.3 F (36.8 C) (Oral)   Resp 15   Ht 5' 2.25" (1.581 m)   Wt 162 lb (73.5 kg)   SpO2 99%   BMI 29.39 kg/m  General:  Well developed, well nourished, no acute distress  Psych:  Alert and orientedx3,normal mood and affect HEENT:   Normocephalic, atraumatic, non-icteric sclera, PERRL, oropharynx is clear without mass or exudate, supple neck without adenopathy, mass or thyromegaly Cardiovascular:  Normal S1, S2, RRR without gallop, rub or murmur, nondisplaced PMI Respiratory:  Good breath sounds bilaterally, CTAB with normal respiratory effort Gastrointestinal: normal bowel sounds, soft, non-tender, no noted masses. No HSM MSK: no deformities, contusions. Joints are without erythema or swelling. Spine and CVA region are nontender Skin:  Warm, no rashes or suspicious lesions noted Neurologic:    Mental status is normal. CN 2-11 are normal. Gross motor and sensory exams are normal. Normal gait. No tremor Breast Exam: No mass, skin retraction or nipple discharge is appreciated in either breast. No axillary adenopathy. Fibrocystic changes are not noted   Commons side effects, risks, benefits, and alternatives for medications and treatment plan prescribed today were discussed, and the patient expressed understanding of the given instructions. Patient is instructed to call or message via MyChart if he/she has any questions or concerns regarding our treatment plan. No barriers to understanding were identified. We discussed Red Flag symptoms and signs in detail. Patient expressed understanding regarding what to do in case of urgent or emergency type symptoms.  Medication list was reconciled, printed and provided to the patient in AVS. Patient instructions and summary information was reviewed with the patient as documented in the AVS. This note was prepared with assistance of Dragon voice recognition software. Occasional wrong-word or sound-a-like substitutions may have occurred due to the inherent limitations of voice recognition software

## 2018-05-27 ENCOUNTER — Other Ambulatory Visit: Payer: Self-pay | Admitting: *Deleted

## 2018-05-27 ENCOUNTER — Other Ambulatory Visit (INDEPENDENT_AMBULATORY_CARE_PROVIDER_SITE_OTHER): Payer: 59

## 2018-05-27 DIAGNOSIS — R17 Unspecified jaundice: Secondary | ICD-10-CM

## 2018-05-28 LAB — BILIRUBIN, FRACTIONATED(TOT/DIR/INDIR)
Bilirubin, Direct: 0.3 mg/dL — ABNORMAL HIGH (ref 0.0–0.2)
Indirect Bilirubin: 1.2 mg/dL (calc) (ref 0.2–1.2)
Total Bilirubin: 1.5 mg/dL — ABNORMAL HIGH (ref 0.2–1.2)

## 2018-06-23 ENCOUNTER — Encounter: Payer: Self-pay | Admitting: Family Medicine

## 2018-06-23 ENCOUNTER — Ambulatory Visit (INDEPENDENT_AMBULATORY_CARE_PROVIDER_SITE_OTHER): Payer: 59 | Admitting: Family Medicine

## 2018-06-23 ENCOUNTER — Other Ambulatory Visit: Payer: Self-pay

## 2018-06-23 VITALS — BP 120/84 | HR 69 | Temp 97.9°F | Ht 62.25 in | Wt 164.6 lb

## 2018-06-23 DIAGNOSIS — F411 Generalized anxiety disorder: Secondary | ICD-10-CM

## 2018-06-23 NOTE — Progress Notes (Signed)
Subjective  CC:  Chief Complaint  Patient presents with  . Anxiety    doing well, patient on wellbutrin     HPI: Helen Lowe is a 47 y.o. female who presents to the office today to address the problems listed above in the chief complaint, mood problems.  Off celexa completely now x 6 weeks. Stable on wellbutrin. Feeling good! Coping well with life stressors. Continues healthy diet and exercise.    [facility] 04/27/2018 01/06/2018  Decreased Interest 0 0  Down, Depressed, Hopeless 0 0  PHQ - 2 Score 0 0  Altered sleeping 0 -  Tired, decreased energy 0 -  Change in appetite 0 -  Feeling bad or failure about yourself  0 -  Trouble concentrating 0 -  Moving slowly or fidgety/restless 0 -  Suicidal thoughts 0 -  PHQ-9 Score 0 -  Difficult doing work/chores Not difficult at all -    Wt Readings from Last 3 Encounters:  06/23/18 164 lb 9.6 oz (74.7 kg)  05/26/18 162 lb (73.5 kg)  04/27/18 162 lb 9.6 oz (73.8 kg)    Assessment  1. GAD (generalized anxiety disorder)      Plan  GAD: well controlled. Monitor for recurrence of symptoms. Continue wellbutrin. Discussed increasing veggies and grains and fruit/fiber and nuts in diet.   Follow up: Return in about 6 months (around 12/24/2018) for mood follow up.  No orders of the defined types were placed in this encounter.  No orders of the defined types were placed in this encounter.     I reviewed the patients updated PMH, FH, and SocHx.    Patient Active Problem List   Diagnosis Date Noted  . GAD (generalized anxiety disorder) 04/08/2011    Priority: High  . GERD (gastroesophageal reflux disease)     Priority: Medium  . Otosclerosis     Priority: Medium  . Hearing loss     Priority: Medium  . Goiter 08/18/2013    Priority: Medium  . IUD (intrauterine device) in place mirena 04/27/2018    Priority: Low  . H/O lymphadenitis     Priority: Low   Current Meds  Medication Sig  . buPROPion  (WELLBUTRIN XL) 150 MG 24 hr tablet Take 1 tablet (150 mg total) by mouth daily.  Marland Kitchen. levonorgestrel (MIRENA, 52 MG,) 20 MCG/24HR IUD by Intrauterine route.  Marland Kitchen. omeprazole (PRILOSEC) 40 MG capsule Take 1 capsule (40 mg total) by mouth daily.    Allergies: Patient is allergic to cephalexin; chlorhexidine gluconate; dicloxacillin; lidocaine; and tetracyclines & related. Family history:  Patient family history includes Breast cancer in her maternal aunt; Glaucoma in her mother; Heart failure in her maternal grandmother; Skin cancer in her sister. Social History   Socioeconomic History  . Marital status: Married    Spouse name: Not on file  . Number of children: Not on file  . Years of education: Not on file  . Highest education level: Not on file  Occupational History  . Not on file  Social Needs  . Financial resource strain: Not on file  . Food insecurity:    Worry: Not on file    Inability: Not on file  . Transportation needs:    Medical: Not on file    Non-medical: Not on file  Tobacco Use  . Smoking status: Never Smoker  . Smokeless tobacco: Never Used  Substance and Sexual Activity  . Alcohol use: Yes    Comment: couple glasses of  wine a month  . Drug use: No  . Sexual activity: Yes    Birth control/protection: IUD  Lifestyle  . Physical activity:    Days per week: Not on file    Minutes per session: Not on file  . Stress: Not on file  Relationships  . Social connections:    Talks on phone: Not on file    Gets together: Not on file    Attends religious service: Not on file    Active member of club or organization: Not on file    Attends meetings of clubs or organizations: Not on file    Relationship status: Not on file  Other Topics Concern  . Not on file  Social History Narrative  . Not on file     Review of Systems: Constitutional: Negative for fever malaise or anorexia Cardiovascular: negative for chest pain Respiratory: negative for SOB or persistent  cough Gastrointestinal: negative for abdominal pain  Objective  Vitals: BP 120/84   Pulse 69   Temp 97.9 F (36.6 C)   Ht 5' 2.25" (1.581 m)   Wt 164 lb 9.6 oz (74.7 kg)   SpO2 99%   BMI 29.86 kg/m  General: no acute distress, well appearing, no apparent distress, well groomed Psych:  Alert and oriented x 3,normal mood, behavior, speech, dress, and thought processes.     Commons side effects, risks, benefits, and alternatives for medications and treatment plan prescribed today were discussed, and the patient expressed understanding of the given instructions. Patient is instructed to call or message via MyChart if he/she has any questions or concerns regarding our treatment plan. No barriers to understanding were identified. We discussed Red Flag symptoms and signs in detail. Patient expressed understanding regarding what to do in case of urgent or emergency type symptoms.   Medication list was reconciled, printed and provided to the patient in AVS. Patient instructions and summary information was reviewed with the patient as documented in the AVS. This note was prepared with assistance of Dragon voice recognition software. Occasional wrong-word or sound-a-like substitutions may have occurred due to the inherent limitations of voice recognition software

## 2018-06-23 NOTE — Patient Instructions (Signed)
Please return in 6 months for mood follow up.  If you have any questions or concerns, please don't hesitate to send me a message via MyChart or call the office at [phone]. Thank you for visiting with us today! It's our pleasure caring for you.   

## 2018-06-26 NOTE — Progress Notes (Signed)
Nl pap. Recheck 3 years

## 2018-07-16 ENCOUNTER — Encounter: Payer: Self-pay | Admitting: Family Medicine

## 2018-07-16 MED ORDER — OMEPRAZOLE 40 MG PO CPDR: 40.0000 mg | DELAYED_RELEASE_CAPSULE | Freq: Every day | ORAL | 1 refills | Status: DC

## 2018-07-16 MED ORDER — BUPROPION HCL ER (XL) 150 MG PO TB24: 150.0000 mg | ORAL_TABLET | Freq: Every day | ORAL | 1 refills | Status: DC

## 2018-07-16 NOTE — Telephone Encounter (Signed)
Received and reviewed medication refill request.  Request is appropriate and was approved.  Please see medication orders for details.  

## 2018-07-22 ENCOUNTER — Ambulatory Visit
Admission: RE | Admit: 2018-07-22 | Discharge: 2018-07-22 | Disposition: A | Discharge status: Home / Self Care | Payer: 59 | Source: Ambulatory Visit | Attending: Family Medicine | Admitting: Family Medicine

## 2018-07-22 DIAGNOSIS — Z1239 Encounter for other screening for malignant neoplasm of breast: Secondary | ICD-10-CM

## 2018-07-22 DIAGNOSIS — Z1231 Encounter for screening mammogram for malignant neoplasm of breast: Secondary | ICD-10-CM | POA: Diagnosis not present

## 2018-09-24 ENCOUNTER — Encounter: Payer: Self-pay | Admitting: Family Medicine

## 2018-11-16 ENCOUNTER — Other Ambulatory Visit: Payer: Self-pay | Admitting: Family Medicine

## 2018-11-23 ENCOUNTER — Ambulatory Visit: Payer: 59 | Admitting: Family Medicine

## 2018-11-23 ENCOUNTER — Encounter: Payer: Self-pay | Admitting: Family Medicine

## 2018-11-23 NOTE — Telephone Encounter (Signed)
Please advise 

## 2018-11-26 ENCOUNTER — Encounter: Payer: Self-pay | Admitting: Family Medicine

## 2018-11-26 ENCOUNTER — Ambulatory Visit (INDEPENDENT_AMBULATORY_CARE_PROVIDER_SITE_OTHER): Payer: 59 | Admitting: Family Medicine

## 2018-11-26 ENCOUNTER — Other Ambulatory Visit: Payer: Self-pay

## 2018-11-26 VITALS — BP 112/72 | HR 79 | Temp 98.8°F | Resp 16 | Ht 62.25 in | Wt 162.2 lb

## 2018-11-26 DIAGNOSIS — R6882 Decreased libido: Secondary | ICD-10-CM | POA: Diagnosis not present

## 2018-11-26 DIAGNOSIS — F411 Generalized anxiety disorder: Secondary | ICD-10-CM

## 2018-11-26 DIAGNOSIS — K219 Gastro-esophageal reflux disease without esophagitis: Secondary | ICD-10-CM | POA: Diagnosis not present

## 2018-11-26 MED ORDER — BUPROPION HCL ER (XL) 300 MG PO TB24: 300.0000 mg | ORAL_TABLET | Freq: Every day | ORAL | 1 refills | Status: DC

## 2018-11-26 NOTE — Progress Notes (Signed)
Subjective  CC:  Chief Complaint  Patient presents with  . Anxiety    She reports that she is needing an increase in Bupropion     HPI: Helen Lowe is a 48 y.o. female who presents to the office today to address the problems listed above in the chief complaint, mood problems.  Aleigha reports increase in anxiety symptoms.  She has multiple stressors including work-related: Louann Sjogren was charged with embezzlement and she has job insecurity issues, daughter recently diagnosed with Crohn's disease and feeling guilty about not finding it earlier, son is a Holiday representative and she is already worried about him going off to college next year.  She has chronic generalized anxiety.  We weaned prior Celexa in July and changed to Wellbutrin.  She has done well on low-dose Wellbutrin since then.  However now complains of insomnia, racing thoughts, feeling overwhelmed, chronic worry and increased irritability.  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  She is using essential oils to help her with sleep.  She denies depressive symptoms.  No adverse effects from Wellbutrin.  Decreased libido  Chronic GERD on omeprazole, questioning whether she still needs it but has not been able to come off due to symptoms.    Wt Readings from Last 3 Encounters:  11/26/18 162 lb 3.2 oz (73.6 kg)  06/23/18 164 lb 9.6 oz (74.7 kg)  05/26/18 162 lb (73.5 kg)    Assessment  1. GAD (generalized anxiety disorder)   2. Gastroesophageal reflux disease, esophagitis presence not specified   3. Decreased libido      Plan   General anxiety disorder, active: Due to multiple stressors listed above.  Increase bupropion to 300 mg daily.  Problem solved regarding delegating some of the household chores to her children and husband.  Continue to work on sleep.  Hopefully, work stressors will lessen over the next 3 to 6 months.  Follow-up if not improving  Chronic GERD requiring omeprazole daily.  Discussed risk  and benefits.  Currently recommend continuing for now.  Once feeling better can can try to come off.  Decreased libido with IUD in place: Could be related to stressors.  Monitor for now.  Discussed alternative therapies including testosterone or hormonal compound creams in the future. Follow up: Return in about 3 months (around 02/25/2019) for mood follow up.  No orders of the defined types were placed in this encounter.  Meds ordered this encounter  Medications  . buPROPion (WELLBUTRIN XL) 300 MG 24 hr tablet    Sig: Take 1 tablet (300 mg total) by mouth daily.    Dispense:  90 tablet    Refill:  1    Replacing 150mg  dose      I reviewed the patients updated PMH, FH, and SocHx.    Patient Active Problem List   Diagnosis Date Noted  . GAD (generalized anxiety disorder) 04/08/2011    Priority: High  . GERD (gastroesophageal reflux disease)     Priority: Medium  . Otosclerosis     Priority: Medium  . Hearing loss     Priority: Medium  . Goiter 08/18/2013    Priority: Medium  . IUD (intrauterine device) in place mirena 04/27/2018    Priority: Low  . H/O lymphadenitis     Priority: Low  . Decreased libido 11/26/2018   Current Meds  Medication Sig  . buPROPion (WELLBUTRIN XL) 300 MG 24 hr tablet Take 1 tablet (300 mg total) by mouth daily.  Marland Kitchen  levonorgestrel (MIRENA, 52 MG,) 20 MCG/24HR IUD by Intrauterine route.  Marland Kitchen omeprazole (PRILOSEC) 40 MG capsule TAKE 1 CAPSULE BY MOUTH  DAILY  . [DISCONTINUED] buPROPion (WELLBUTRIN XL) 150 MG 24 hr tablet Take 1 tablet (150 mg total) by mouth daily.    Allergies: Patient is allergic to cephalexin; chlorhexidine gluconate; dicloxacillin; lidocaine; and tetracyclines & related. Family history:  Patient family history includes Asthma in her daughter; Breast cancer in her maternal aunt; Crohn's disease in her daughter; Glaucoma in her mother; Heart failure in her maternal grandmother; Skin cancer in her sister. Social History    Socioeconomic History  . Marital status: Married    Spouse name: Not on file  . Number of children: 2  . Years of education: Not on file  . Highest education level: Not on file  Occupational History  . Occupation: Administrator, arts; [employer]    Employer: [employer]  Social Needs  . Financial resource strain: Not on file  . Food insecurity:    Worry: Not on file    Inability: Not on file  . Transportation needs:    Medical: Not on file    Non-medical: Not on file  Tobacco Use  . Smoking status: Never Smoker  . Smokeless tobacco: Never Used  Substance and Sexual Activity  . Alcohol use: Yes    Comment: couple glasses of wine a month  . Drug use: No  . Sexual activity: Yes    Birth control/protection: I.U.D.  Lifestyle  . Physical activity:    Days per week: Not on file    Minutes per session: Not on file  . Stress: Not on file  Relationships  . Social connections:    Talks on phone: Not on file    Gets together: Not on file    Attends religious service: Not on file    Active member of club or organization: Not on file    Attends meetings of clubs or organizations: Not on file    Relationship status: Not on file  Other Topics Concern  . Not on file  Social History Narrative  . Not on file     Review of Systems: Constitutional: Negative for fever malaise or anorexia Cardiovascular: negative for chest pain Respiratory: negative for SOB or persistent cough Gastrointestinal: negative for abdominal pain  Objective  Vitals: BP 112/72   Pulse 79   Temp 98.8 F (37.1 C) (Oral)   Resp 16   Ht 5' 2.25" (1.581 m)   Wt 162 lb 3.2 oz (73.6 kg)   SpO2 99%   BMI 29.43 kg/m  General: no acute distress, well appearing, no apparent distress, well groomed Psych:  Alert and oriented x 3,anxious.  Well-groomed, normal cognition and speech.  Good eye contact Skin:  Warm, no rashes    Commons side effects, risks, benefits, and alternatives for  medications and treatment plan prescribed today were discussed, and the patient expressed understanding of the given instructions. Patient is instructed to call or message via MyChart if he/she has any questions or concerns regarding our treatment plan. No barriers to understanding were identified. We discussed Red Flag symptoms and signs in detail. Patient expressed understanding regarding what to do in case of urgent or emergency type symptoms.   Medication list was reconciled, printed and provided to the patient in AVS. Patient instructions and summary information was reviewed with the patient as documented in the AVS. This note was prepared with assistance of Dragon voice recognition software. Occasional  wrong-word or sound-a-like substitutions may have occurred due to the inherent limitations of voice recognition software

## 2018-11-26 NOTE — Patient Instructions (Signed)
Please return in 3 months for Recheck on sleep and mood.  If you have any questions or concerns, please don't hesitate to send me a message via MyChart or call the office at [phone]. Thank you for visiting with Helen Lowe today! It's our pleasure caring for you.  Valerian root can be helpful with sleep as can melatonin.    Stress Stress is a normal reaction to life events. Stress is what you feel when life demands more than you are used to, or more than you think you can handle. Some stress can be useful, such as studying for a test or meeting a deadline at work. Stress that occurs too often or for too long can cause problems. It can affect your emotional health and interfere with relationships and normal daily activities. Too much stress can weaken your body's defense system (immune system) and increase your risk for physical illness. If you already have a medical problem, stress can make it worse. What are the causes? All sorts of life events can cause stress. An event that causes stress for one person may not be stressful for another person. Major life events, whether positive or negative, commonly cause stress. Examples include:  Losing a job or starting a new job.  Losing a loved one.  Moving to a new town or home.  Getting married or divorced.  Having a baby.  Injury or illness. Less obvious life events can also cause stress, especially if they occur day after day or in combination with each other. Examples include:  Working long hours.  Driving in traffic.  Caring for children.  Being in debt.  Being in a difficult relationship. What are the signs or symptoms? Stress can cause emotional symptoms, including:  Anxiety. This is feeling worried, afraid, on edge, overwhelmed, or out of control.  Anger, including irritation or impatience.  Depression. This is feeling sad, down, helpless, or guilty.  Trouble focusing, remembering, or making decisions. Stress can cause  physical symptoms, including:  Aches and pains. These may affect your head, neck, back, stomach, or other areas of your body.  Tight muscles or a clenched jaw.  Low energy.  Trouble sleeping. Stress can cause unhealthy behaviors, including:  Eating to feel better (overeating) or skipping meals.  Working too much or putting off tasks.  Smoking, drinking alcohol, or using drugs to feel better. How is this diagnosed? Stress is diagnosed through an assessment by your health care provider. He or she may diagnose this condition based on:  Your symptoms and any stressful life events.  Your medical history.  Tests to rule out other causes of your symptoms. Depending on your condition, your health care provider may refer you to a specialist for further evaluation. How is this treated?  Stress management techniques are the recommended treatment for stress. Medicine is not typically recommended for the treatment of stress. Techniques to reduce your reaction to stressful life events include:  Stress identification. Monitor yourself for symptoms of stress and identify what causes stress for you. These skills may help you to avoid or prepare for stressful events.  Time management. Set your priorities, keep a calendar of events, and learn to say "no." Taking these actions can help you avoid making too many commitments. Techniques for coping with stress include:  Rethinking the problem. Try to think realistically about stressful events rather than ignoring them or overreacting. Try to find the positives in a stressful situation rather than focusing on the negatives.  Exercise. Physical exercise can  release both physical and emotional tension. The key is to find a form of exercise that you enjoy and do it regularly.  Relaxation techniques. These relax the body and mind. The key is to find one or more that you enjoy and use the technique(s) regularly. Examples include: ? Meditation, deep  breathing, or progressive relaxation techniques. ? Yoga or tai chi. ? Biofeedback, mindfulness techniques, or journaling. ? Listening to music, being out in nature, or participating in other hobbies.  Practicing a healthy lifestyle. Eat a balanced diet, drink plenty of water, limit or avoid caffeine, and get plenty of sleep.  Having a strong support network. Spend time with family, friends, or other people you enjoy being around. Express your feelings and talk things over with someone you trust. Counseling or talk therapy with a mental health professional may be helpful if you are having trouble managing stress on your own. Follow these instructions at home: Lifestyle   Avoid drugs.  Do not use any products that contain nicotine or tobacco, such as cigarettes and e-cigarettes. If you need help quitting, ask your health care provider.  Limit alcohol intake to no more than 1 drink a day for nonpregnant women and 2 drinks a day for men. One drink equals 12 oz of beer, 5 oz of wine, or 1 oz of hard liquor.  Do not use alcohol or drugs to relax.  Eat a balanced diet that includes fresh fruits and vegetables, whole grains, lean meats, fish, eggs, and beans, and low-fat dairy. Avoid processed foods and foods high in added fat, sugar, and salt.  Exercise at least 30 minutes on 5 or more days each week.  Get 7-8 hours of sleep each night. General instructions   Practice stress management techniques as discussed with your health care provider.  Drink enough fluid to keep your urine clear or pale yellow.  Take over-the-counter and prescription medicines only as told by your health care provider.  Keep all follow-up visits as told by your health care provider. This is important. Contact a health care provider if:  Your symptoms get worse.  You have new symptoms.  You feel overwhelmed by your problems and can no longer manage them on your own. Get help right away if:  You have  thoughts of hurting yourself or others. If you ever feel like you may hurt yourself or others, or have thoughts about taking your own life, get help right away. You can go to your nearest emergency department or call:  Your local emergency services (911 in the [place]  A suicide crisis helpline, such as the [place] at 802 809 8727. This is open 24 hours a day. Summary  Stress is a normal reaction to life events. It can cause problems if it happens too often or for too long.  Practicing stress management techniques is the best way to treat stress.  Counseling or talk therapy with a mental health professional may be helpful if you are having trouble managing stress on your own. This information is not intended to replace advice given to you by your health care provider. Make sure you discuss any questions you have with your health care provider. Document Released: 05/07/2001 Document Revised: 01/01/2017 Document Reviewed: 01/01/2017 Elsevier Interactive Patient Education  2019 [employer].

## 2018-12-11 ENCOUNTER — Ambulatory Visit (INDEPENDENT_AMBULATORY_CARE_PROVIDER_SITE_OTHER): Payer: 59

## 2018-12-11 ENCOUNTER — Encounter: Payer: Self-pay | Admitting: Family Medicine

## 2018-12-11 ENCOUNTER — Ambulatory Visit (INDEPENDENT_AMBULATORY_CARE_PROVIDER_SITE_OTHER): Payer: 59 | Admitting: Family Medicine

## 2018-12-11 ENCOUNTER — Telehealth: Payer: Self-pay | Admitting: Family Medicine

## 2018-12-11 ENCOUNTER — Ambulatory Visit: Payer: Self-pay

## 2018-12-11 ENCOUNTER — Other Ambulatory Visit: Payer: Self-pay

## 2018-12-11 ENCOUNTER — Other Ambulatory Visit: Payer: Self-pay | Admitting: *Deleted

## 2018-12-11 VITALS — BP 106/68 | HR 68 | Temp 98.0°F | Resp 16 | Ht 62.25 in | Wt 161.6 lb

## 2018-12-11 DIAGNOSIS — R1010 Upper abdominal pain, unspecified: Secondary | ICD-10-CM

## 2018-12-11 DIAGNOSIS — F411 Generalized anxiety disorder: Secondary | ICD-10-CM

## 2018-12-11 DIAGNOSIS — K5900 Constipation, unspecified: Secondary | ICD-10-CM | POA: Diagnosis not present

## 2018-12-11 LAB — CBC WITH DIFFERENTIAL/PLATELET
BASOS PCT: 0.3 % (ref 0.0–3.0)
Basophils Absolute: 0 10*3/uL (ref 0.0–0.1)
EOS PCT: 0.8 % (ref 0.0–5.0)
Eosinophils Absolute: 0.1 10*3/uL (ref 0.0–0.7)
HCT: 42.2 % (ref 36.0–46.0)
HEMOGLOBIN: 14.8 g/dL (ref 12.0–15.0)
Lymphocytes Relative: 21.8 % (ref 12.0–46.0)
Lymphs Abs: 1.5 10*3/uL (ref 0.7–4.0)
MCHC: 35.1 g/dL (ref 30.0–36.0)
MCV: 91.2 fl (ref 78.0–100.0)
MONO ABS: 0.9 10*3/uL (ref 0.1–1.0)
Monocytes Relative: 13.6 % — ABNORMAL HIGH (ref 3.0–12.0)
Neutro Abs: 4.3 10*3/uL (ref 1.4–7.7)
Neutrophils Relative %: 63.5 % (ref 43.0–77.0)
Platelets: 308 10*3/uL (ref 150.0–400.0)
RBC: 4.63 Mil/uL (ref 3.87–5.11)
RDW: 13.1 % (ref 11.5–15.5)
WBC: 6.8 10*3/uL (ref 4.0–10.5)

## 2018-12-11 LAB — BASIC METABOLIC PANEL
BUN: 8 mg/dL (ref 6–23)
CO2: 28 mEq/L (ref 19–32)
Calcium: 9.3 mg/dL (ref 8.4–10.5)
Chloride: 104 mEq/L (ref 96–112)
Creatinine, Ser: 0.74 mg/dL (ref 0.40–1.20)
GFR: 83.31 mL/min (ref >60.00)
GLUCOSE: 103 mg/dL — AB (ref 70–99)
Potassium: 3.9 mEq/L (ref 3.5–5.1)
Sodium: 139 mEq/L (ref 135–145)

## 2018-12-11 LAB — HEPATIC FUNCTION PANEL
ALT: 23 U/L (ref 0–35)
AST: 15 U/L (ref 0–37)
Albumin: 4.2 g/dL (ref 3.5–5.2)
Alkaline Phosphatase: 73 U/L (ref 39–117)
Bilirubin, Direct: 0.2 mg/dL (ref 0.0–0.3)
Total Bilirubin: 1.1 mg/dL (ref 0.2–1.2)
Total Protein: 6.7 g/dL (ref 6.0–8.3)

## 2018-12-11 LAB — LIPASE: Lipase: 26 U/L (ref 11.0–59.0)

## 2018-12-11 MED ORDER — ALPRAZOLAM 0.5 MG PO TABS: 0.5000 mg | ORAL_TABLET | Freq: Every day | ORAL | 0 refills | Status: DC | PRN

## 2018-12-11 MED ORDER — DICYCLOMINE HCL 10 MG PO CAPS: 10.0000 mg | ORAL_CAPSULE | Freq: Three times a day (TID) | ORAL | 1 refills | Status: DC

## 2018-12-11 MED ORDER — ALPRAZOLAM 0.5 MG PO TABS: 0.5000 mg | ORAL_TABLET | Freq: Every day | ORAL | 0 refills | Status: AC | PRN

## 2018-12-11 NOTE — Telephone Encounter (Signed)
Phone call to Joaquim Lai at [facility] order pharm.  Advised that the Rx's for Alprazolam and Dicyclomine can be voided at [facility], as they have been reordered at pt's local pharmacy, per pt. Request.  Verb. Understanding.

## 2018-12-11 NOTE — Telephone Encounter (Signed)
Pt. Reports she started having abdominal discomfort last Friday. Thought it was gas pain. Has continued on and off all week. Felt like she had some constipation issues, but had diarrhea x 1 this week. Pain is upper abdomen, across entire belly. Appointment made for this morning.  Reason for Disposition . [1] MODERATE pain (e.g., interferes with normal activities) AND [2] pain comes and goes (cramps) AND [3] present > 24 hours  (Exception: pain with Vomiting or Diarrhea - see that Guideline)  Answer Assessment - Initial Assessment Questions 1. LOCATION: "Where does it hurt?"      Upper abd. - middle and on the sides. Above the belly button 2. RADIATION: "Does the pain shoot anywhere else?" (e.g., chest, back)     No 3. ONSET: "When did the pain begin?" (e.g., minutes, hours or days ago)      Last Friday 4. SUDDEN: "Gradual or sudden onset?"     Sudden 5. PATTERN "Does the pain come and go, or is it constant?"    - If constant: "Is it getting better, staying the same, or worsening?"      (Note: Constant means the pain never goes away completely; most serious pain is constant and it progresses)     - If intermittent: "How long does it last?" "Do you have pain now?"     (Note: Intermittent means the pain goes away completely between bouts)      Comes and goes 6. SEVERITY: "How bad is the pain?"  (e.g., Scale 1-10; mild, moderate, or severe)   - MILD (1-3): doesn't interfere with normal activities, abdomen soft and not tender to touch    - MODERATE (4-7): interferes with normal activities or awakens from sleep, tender to touch    - SEVERE (8-10): excruciating pain, doubled over, unable to do any normal activities      6 7. RECURRENT SYMPTOM: "Have you ever had this type of abdominal pain before?" If so, ask: "When was the last time?" and "What happened that time?"      No 8. CAUSE: "What do you think is causing the abdominal pain?"     Unsure 9. RELIEVING/AGGRAVATING FACTORS: "What makes it  better or worse?" (e.g., movement, antacids, bowel movement)     Bowel movement helped 10. OTHER SYMPTOMS: "Has there been any vomiting, diarrhea, constipation, or urine problems?"       Diarrhea x 1  11. PREGNANCY: "Is there any chance you are pregnant?" "When was your last menstrual period?"       No  Protocols used: ABDOMINAL PAIN - [facility]

## 2018-12-11 NOTE — Telephone Encounter (Signed)
Copied from CRM [id number]. Topic: Quick Communication - Rx Refill/Question >> Dec 11, 2018  1:48 PM [place], [place] D wrote: Medication: ALPRAZolam Prudy Feeler) 0.5 MG tablet  / dicyclomine (BENTYL) 10 MG capsule / Pt called earlier because rx's were sent to [facility] instead of [facility]. Agent let front desk know that they needed to void rx's sent to [facility] so that pt could pick up Rx's today. [facility] however [facility] stated that they still need to hear from provider in order to void rxs sent to them originally. Pt cannot [facility]xs at [facility] until this is done. Please advise and let pt know once [facility] has been contacted so that pt may [facility].  Has the patient contacted their pharmacy? Yes.   (Agent: If no, request that the patient contact the pharmacy for the refill.) (Agent: If yes, when and what did the pharmacy advise?)  Preferred Pharmacy (with phone number or street name): [facility] - [place], [place] - [address] [phone] (Phone) [phone] (Fax)    Agent: Please be advised that RX refills may take up to 3 business days. We ask that you follow-up with your pharmacy.

## 2018-12-11 NOTE — Patient Instructions (Signed)
Please follow up if symptoms do not improve or as needed.   Please go to our [facility] office to get your xrays done. You can walk in M-F between 8am and 5pm. Tell them you are there for xrays ordered by me. They will send me the results, then I will let you know the results with instructions.   Address: [address] [place], [place], [place] [phone]  (office sits at [address] at [address] intersection; from here, turn left onto [place] [address]), take to [address], turn right and go for a mile or so, office will be on left across form [address] )  I will release your lab results to you on your MyChart account with further instructions. Please reply with any questions.   Start the bentyl, tylenol and/or pepto bismal as needed for pain.  Return for fever or increasing pain.   I have placed an order for an ultrasound and we will call you to schedule. This will likely be early next week.    Abdominal Pain, Adult  Many things can cause belly (abdominal) pain. Most times, belly pain is not dangerous. Many cases of belly pain can be watched and treated at home. Sometimes belly pain is serious, though. Your doctor will try to find the cause of your belly pain. Follow these instructions at home:  Take over-the-counter and prescription medicines only as told by your doctor. Do not take medicines that help you poop (laxatives) unless told to by your doctor.  Drink enough fluid to keep your pee (urine) clear or pale yellow.  Watch your belly pain for any changes.  Keep all follow-up visits as told by your doctor. This is important. Contact a doctor if:  Your belly pain changes or gets worse.  You are not hungry, or you lose weight without trying.  You are having trouble pooping (constipated) or have watery poop (diarrhea) for more than 2-3 days.  You have pain when you pee or poop.  Your belly pain wakes you up at night.  Your  pain gets worse with meals, after eating, or with certain foods.  You are throwing up and cannot keep anything down.  You have a fever. Get help right away if:  Your pain does not go away as soon as your doctor says it should.  You cannot stop throwing up.  Your pain is only in areas of your belly, such as the right side or the left lower part of the belly.  You have bloody or black poop, or poop that looks like tar.  You have very bad pain, cramping, or bloating in your belly.  You have signs of not having enough fluid or water in your body (dehydration), such as: ? Dark pee, very little pee, or no pee. ? Cracked lips. ? Dry mouth. ? Sunken eyes. ? Sleepiness. ? Weakness. This information is not intended to replace advice given to you by your health care provider. Make sure you discuss any questions you have with your health care provider. Document Released: 04/29/2008 Document Revised: 05/31/2016 Document Reviewed: 04/24/2016 Elsevier Interactive Patient Education  2019 [employer].

## 2018-12-11 NOTE — Progress Notes (Signed)
Subjective  CC:  Chief Complaint  Patient presents with  . Abdominal Pain    Pain started last week in the upper stomach. Has tried GasX, Colace, and heating pad. Abnormal bowel movements    HPI: Helen Lowe is a 48 y.o. female who presents to the office today to address the problems listed above in the chief complaint.  48 year old with history of GERD on high-dose daily PPI presents for upper abdominal pain that started about a week ago.  Symptoms started abruptly.  Described as crampy bloating symptoms.  She denies nausea or vomiting.  She had been constipated the week prior to her symptoms.  Appetite has been mildly decreased but she is tolerating a bland diet.  She did have chicken salad yesterday for lunch and had postprandial diarrhea which did help her symptoms.  She denies mucus in the stool or blood in the stool.  She denies belching, fever, chills.  She denies history of diagnosed IBS but admits that she will have loose stools when she is stressed.  Stress levels remain high.  She has her gallbladder.  She has mild aching pain currently.  She was able to tolerate rice this morning for breakfast.  Anxiety: We increased Wellbutrin at last visit.  Still with work stress.  Would like to use low-dose Xanax as needed.  No panic attacks.  Assessment  1. Upper abdominal pain   2. GAD (generalized anxiety disorder)      Plan   Upper abdominal pain with right upper quadrant tenderness on exam: Start work-up with blood work, KUB, right upper quadrant ultrasound.  Treat pain with Tylenol, Pepto-Bismol, and trial of Bentyl.  Differential diagnosis includes cholelithiasis, hepatitis, gastritis, pancreatitis, IBS, constipation or other.  Discussed red flag symptoms.  Further treatment and evaluation options pending results of above.  Patient understands and agrees with care plan  Anxiety: Add low-dose Xanax as needed.  Risk and benefits discussed  Follow up: Return if symptoms worsen or  fail to improve.  Visit date not found  Orders Placed This Encounter  Procedures  . DG Abd 1 View  . US Abdomen Limited RUQ  . CBC with Differential/Platelet  . Lipase  . Basic metabolic panel  . Hepatic function panel   Meds ordered this encounter  Medications  . ALPRAZolam (XANAX) 0.5 MG tablet    Sig: Take 1 tablet (0.5 mg total) by mouth daily as needed for anxiety.    Dispense:  30 tablet    Refill:  0  . dicyclomine (BENTYL) 10 MG capsule    Sig: Take 1 capsule (10 mg total) by mouth 3 (three) times daily before meals. As needed for crampy abdominal pain    Dispense:  30 capsule    Refill:  1      I reviewed the patients updated PMH, FH, and SocHx.    Patient Active Problem List   Diagnosis Date Noted  . GAD (generalized anxiety disorder) 04/08/2011    Priority: High  . GERD (gastroesophageal reflux disease)     Priority: Medium  . Otosclerosis     Priority: Medium  . Hearing loss     Priority: Medium  . Goiter 08/18/2013    Priority: Medium  . IUD (intrauterine device) in place mirena 04/27/2018    Priority: Low  . H/O lymphadenitis     Priority: Low  . Decreased libido 11/26/2018   Current Meds  Medication Sig  . buPROPion (WELLBUTRIN XL) 300 MG 24 hr tablet  Take 1 tablet (300 mg total) by mouth daily.  Marland Kitchen. levonorgestrel (MIRENA, 52 MG,) 20 MCG/24HR IUD by Intrauterine route.  Marland Kitchen. omeprazole (PRILOSEC) 40 MG capsule TAKE 1 CAPSULE BY MOUTH  DAILY    Allergies: Patient is allergic to cephalexin; chlorhexidine gluconate; dicloxacillin; lidocaine; and tetracyclines & related. Family History: Patient family history includes Asthma in her daughter; Breast cancer in her maternal aunt; Crohn's disease in her daughter; Glaucoma in her mother; Heart failure in her maternal grandmother; Skin cancer in her sister. Social History:  Patient  reports that she has never smoked. She has never used smokeless tobacco. She reports current alcohol use. She reports that she  does not use drugs.  Review of Systems: Constitutional: Negative for fever malaise or anorexia Cardiovascular: negative for chest pain Respiratory: negative for SOB or persistent cough Gastrointestinal: negative for abdominal pain  Objective  Vitals: BP 106/68   Pulse 68   Temp 98 F (36.7 C) (Oral)   Resp 16   Ht 5' 2.25" (1.581 m)   Wt 161 lb 9.6 oz (73.3 kg)   SpO2 98%   BMI 29.32 kg/m  General: no acute distress , A&Ox3, appears comfortable HEENT: PEERL, conjunctiva normal, Oropharynx moist,neck is supple Cardiovascular:  RRR without murmur or gallop.  Respiratory:  Good breath sounds bilaterally, CTAB with normal respiratory effort Gastrointestinal: soft, flat abdomen, normal active bowel sounds, no palpable masses, no hepatosplenomegaly, no appreciated hernias Right upper quadrant tenderness is present without rebound or guarding  Skin:  Warm, no rashes     Commons side effects, risks, benefits, and alternatives for medications and treatment plan prescribed today were discussed, and the patient expressed understanding of the given instructions. Patient is instructed to call or message via MyChart if he/she has any questions or concerns regarding our treatment plan. No barriers to understanding were identified. We discussed Red Flag symptoms and signs in detail. Patient expressed understanding regarding what to do in case of urgent or emergency type symptoms.   Medication list was reconciled, printed and provided to the patient in AVS. Patient instructions and summary information was reviewed with the patient as documented in the AVS. This note was prepared with assistance of Dragon voice recognition software. Occasional wrong-word or sound-a-like substitutions may have occurred due to the inherent limitations of voice recognition software

## 2018-12-14 ENCOUNTER — Encounter: Payer: Self-pay | Admitting: Family Medicine

## 2018-12-14 ENCOUNTER — Ambulatory Visit
Admission: RE | Admit: 2018-12-14 | Discharge: 2018-12-14 | Disposition: A | Discharge status: Home / Self Care | Payer: 59 | Source: Ambulatory Visit | Attending: Family Medicine | Admitting: Family Medicine

## 2018-12-14 DIAGNOSIS — R1010 Upper abdominal pain, unspecified: Secondary | ICD-10-CM

## 2018-12-14 DIAGNOSIS — K8020 Calculus of gallbladder without cholecystitis without obstruction: Secondary | ICD-10-CM

## 2018-12-22 NOTE — Telephone Encounter (Signed)
Copied from CRM [id number]. Topic: Referral - Status >> Dec 22, 2018  1:30 PM Mickel Baas B, [place] wrote: Reason for CRM: patient calling to check status of referral for surgery. Please advise. Would like to know if there is anything she can do on her end to speed up the process?

## 2018-12-29 ENCOUNTER — Other Ambulatory Visit: Payer: Self-pay

## 2018-12-29 ENCOUNTER — Encounter (HOSPITAL_BASED_OUTPATIENT_CLINIC_OR_DEPARTMENT_OTHER): Payer: Self-pay | Admitting: *Deleted

## 2018-12-29 ENCOUNTER — Other Ambulatory Visit: Payer: Self-pay | Admitting: General Surgery

## 2018-12-29 DIAGNOSIS — K8020 Calculus of gallbladder without cholecystitis without obstruction: Secondary | ICD-10-CM | POA: Diagnosis not present

## 2018-12-30 ENCOUNTER — Encounter (HOSPITAL_BASED_OUTPATIENT_CLINIC_OR_DEPARTMENT_OTHER)
Admission: RE | Admit: 2018-12-30 | Discharge: 2018-12-30 | Disposition: A | Discharge status: Home / Self Care | Payer: 59 | Source: Ambulatory Visit | Attending: General Surgery | Admitting: General Surgery

## 2018-12-30 ENCOUNTER — Encounter (HOSPITAL_BASED_OUTPATIENT_CLINIC_OR_DEPARTMENT_OTHER): Payer: Self-pay | Admitting: *Deleted

## 2018-12-30 DIAGNOSIS — K828 Other specified diseases of gallbladder: Secondary | ICD-10-CM | POA: Diagnosis not present

## 2018-12-30 DIAGNOSIS — K8010 Calculus of gallbladder with chronic cholecystitis without obstruction: Secondary | ICD-10-CM | POA: Diagnosis not present

## 2018-12-30 DIAGNOSIS — Z833 Family history of diabetes mellitus: Secondary | ICD-10-CM | POA: Diagnosis not present

## 2018-12-30 DIAGNOSIS — Z79899 Other long term (current) drug therapy: Secondary | ICD-10-CM | POA: Diagnosis not present

## 2018-12-30 DIAGNOSIS — F419 Anxiety disorder, unspecified: Secondary | ICD-10-CM | POA: Diagnosis not present

## 2018-12-30 DIAGNOSIS — Z881 Allergy status to other antibiotic agents status: Secondary | ICD-10-CM | POA: Diagnosis not present

## 2018-12-30 DIAGNOSIS — Z886 Allergy status to analgesic agent status: Secondary | ICD-10-CM | POA: Diagnosis not present

## 2018-12-30 DIAGNOSIS — K219 Gastro-esophageal reflux disease without esophagitis: Secondary | ICD-10-CM | POA: Diagnosis not present

## 2018-12-30 DIAGNOSIS — K82A1 Gangrene of gallbladder in cholecystitis: Secondary | ICD-10-CM | POA: Diagnosis not present

## 2018-12-30 LAB — COMPREHENSIVE METABOLIC PANEL
ALBUMIN: 3.8 g/dL (ref 3.5–5.0)
ALK PHOS: 92 U/L (ref 38–126)
ALT: 29 U/L (ref 0–44)
AST: 20 U/L (ref 15–41)
Anion gap: 11 (ref 5–15)
BUN: 5 mg/dL — ABNORMAL LOW (ref 6–20)
CO2: 24 mmol/L (ref 22–32)
Calcium: 9.2 mg/dL (ref 8.9–10.3)
Chloride: 104 mmol/L (ref 98–111)
Creatinine, Ser: 0.76 mg/dL (ref 0.44–1.00)
GFR calc Af Amer: >60 mL/min (ref >60)
GFR calc non Af Amer: >60 mL/min (ref >60)
Glucose, Bld: 93 mg/dL (ref 70–99)
Potassium: 4 mmol/L (ref 3.5–5.1)
Sodium: 139 mmol/L (ref 135–145)
Total Bilirubin: 1 mg/dL (ref 0.3–1.2)
Total Protein: 7.1 g/dL (ref 6.5–8.1)

## 2018-12-30 LAB — CBC WITH DIFFERENTIAL/PLATELET
Abs Immature Granulocytes: 0.02 10*3/uL (ref 0.00–0.07)
Basophils Absolute: 0 10*3/uL (ref 0.0–0.1)
Basophils Relative: 1 %
Eosinophils Absolute: 0.1 10*3/uL (ref 0.0–0.5)
Eosinophils Relative: 2 %
HCT: 42.3 % (ref 36.0–46.0)
Hemoglobin: 13.9 g/dL (ref 12.0–15.0)
IMMATURE GRANULOCYTES: 0 %
LYMPHS PCT: 23 %
Lymphs Abs: 1.9 10*3/uL (ref 0.7–4.0)
MCH: 30 pg (ref 26.0–34.0)
MCHC: 32.9 g/dL (ref 30.0–36.0)
MCV: 91.2 fL (ref 80.0–100.0)
Monocytes Absolute: 1 10*3/uL (ref 0.1–1.0)
Monocytes Relative: 11 %
Neutro Abs: 5.5 10*3/uL (ref 1.7–7.7)
Neutrophils Relative %: 63 %
Platelets: 388 10*3/uL (ref 150–400)
RBC: 4.64 MIL/uL (ref 3.87–5.11)
RDW: 12.3 % (ref 11.5–15.5)
WBC: 8.6 10*3/uL (ref 4.0–10.5)
nRBC: 0 % (ref 0.0–0.2)

## 2018-12-30 LAB — LIPASE, BLOOD: Lipase: 34 U/L (ref 11–51)

## 2018-12-30 MED ORDER — ENSURE PRE-SURGERY PO LIQD: 296.0000 mL | Freq: Once | ORAL | Status: DC

## 2018-12-30 NOTE — Progress Notes (Signed)
Ensure pre surgery drink given with instructions to complete by 0915 dos, pt verbalized understanding. 

## 2018-12-31 ENCOUNTER — Other Ambulatory Visit: Payer: Self-pay

## 2018-12-31 ENCOUNTER — Encounter (HOSPITAL_BASED_OUTPATIENT_CLINIC_OR_DEPARTMENT_OTHER)
Admission: RE | Disposition: A | Discharge status: Home / Self Care | Payer: Self-pay | Source: Home / Self Care | Attending: General Surgery

## 2018-12-31 ENCOUNTER — Ambulatory Visit (HOSPITAL_BASED_OUTPATIENT_CLINIC_OR_DEPARTMENT_OTHER): Payer: 59 | Admitting: Anesthesiology

## 2018-12-31 ENCOUNTER — Encounter (HOSPITAL_BASED_OUTPATIENT_CLINIC_OR_DEPARTMENT_OTHER): Payer: Self-pay | Admitting: Anesthesiology

## 2018-12-31 ENCOUNTER — Ambulatory Visit (HOSPITAL_BASED_OUTPATIENT_CLINIC_OR_DEPARTMENT_OTHER)
Admission: RE | Admit: 2018-12-31 | Discharge: 2019-01-01 | Disposition: A | Discharge status: Home / Self Care | Payer: 59 | Attending: General Surgery | Admitting: General Surgery

## 2018-12-31 DIAGNOSIS — G8918 Other acute postprocedural pain: Secondary | ICD-10-CM | POA: Diagnosis not present

## 2018-12-31 DIAGNOSIS — K82A1 Gangrene of gallbladder in cholecystitis: Secondary | ICD-10-CM | POA: Insufficient documentation

## 2018-12-31 DIAGNOSIS — K8010 Calculus of gallbladder with chronic cholecystitis without obstruction: Secondary | ICD-10-CM | POA: Diagnosis not present

## 2018-12-31 DIAGNOSIS — Z79899 Other long term (current) drug therapy: Secondary | ICD-10-CM | POA: Insufficient documentation

## 2018-12-31 DIAGNOSIS — Z9049 Acquired absence of other specified parts of digestive tract: Secondary | ICD-10-CM

## 2018-12-31 DIAGNOSIS — K219 Gastro-esophageal reflux disease without esophagitis: Secondary | ICD-10-CM | POA: Insufficient documentation

## 2018-12-31 DIAGNOSIS — K8020 Calculus of gallbladder without cholecystitis without obstruction: Secondary | ICD-10-CM | POA: Diagnosis not present

## 2018-12-31 DIAGNOSIS — Z886 Allergy status to analgesic agent status: Secondary | ICD-10-CM | POA: Insufficient documentation

## 2018-12-31 DIAGNOSIS — F419 Anxiety disorder, unspecified: Secondary | ICD-10-CM | POA: Insufficient documentation

## 2018-12-31 DIAGNOSIS — K828 Other specified diseases of gallbladder: Secondary | ICD-10-CM | POA: Insufficient documentation

## 2018-12-31 DIAGNOSIS — Z833 Family history of diabetes mellitus: Secondary | ICD-10-CM | POA: Insufficient documentation

## 2018-12-31 DIAGNOSIS — Z881 Allergy status to other antibiotic agents status: Secondary | ICD-10-CM | POA: Insufficient documentation

## 2018-12-31 HISTORY — PX: CHOLECYSTECTOMY: SHX55

## 2018-12-31 HISTORY — DX: Adverse effect of unspecified anesthetic, initial encounter: T41.45XA

## 2018-12-31 HISTORY — DX: Other complications of anesthesia, initial encounter: T88.59XA

## 2018-12-31 SURGERY — LAPAROSCOPIC CHOLECYSTECTOMY
Anesthesia: General | Site: Abdomen

## 2018-12-31 MED ORDER — FENTANYL CITRATE (PF) 100 MCG/2ML IJ SOLN
INTRAMUSCULAR | Status: AC
  Filled 2018-12-31: qty 2

## 2018-12-31 MED ORDER — SUGAMMADEX SODIUM 200 MG/2ML IV SOLN: INTRAVENOUS | Status: DC | PRN

## 2018-12-31 MED ORDER — METHOCARBAMOL 500 MG PO TABS
500.0000 mg | ORAL_TABLET | Freq: Four times a day (QID) | ORAL | Status: DC | PRN
  Administered 2018-12-31 – 2019-01-01 (×2): 500 mg via ORAL
  Filled 2018-12-31 (×2): qty 1

## 2018-12-31 MED ORDER — KETOROLAC TROMETHAMINE 15 MG/ML IJ SOLN
INTRAMUSCULAR | Status: AC
  Filled 2018-12-31: qty 1

## 2018-12-31 MED ORDER — CIPROFLOXACIN IN D5W 400 MG/200ML IV SOLN
400.0000 mg | Freq: Two times a day (BID) | INTRAVENOUS | Status: AC
  Administered 2018-12-31 – 2019-01-01 (×2): 400 mg via INTRAVENOUS
  Filled 2018-12-31 (×2): qty 200

## 2018-12-31 MED ORDER — FENTANYL CITRATE (PF) 100 MCG/2ML IJ SOLN
25.0000 ug | INTRAMUSCULAR | Status: DC | PRN
  Administered 2018-12-31: 25 ug via INTRAVENOUS

## 2018-12-31 MED ORDER — PROMETHAZINE HCL 25 MG/ML IJ SOLN
6.2500 mg | Freq: Once | INTRAMUSCULAR | Status: AC | PRN
  Administered 2018-12-31: 6.25 mg via INTRAVENOUS

## 2018-12-31 MED ORDER — LACTATED RINGERS IV SOLN
INTRAVENOUS | Status: DC
  Administered 2018-12-31: 12:00:00 via INTRAVENOUS

## 2018-12-31 MED ORDER — GABAPENTIN 100 MG PO CAPS
ORAL_CAPSULE | ORAL | Status: AC
  Filled 2018-12-31: qty 1

## 2018-12-31 MED ORDER — PROPOFOL 10 MG/ML IV BOLUS
INTRAVENOUS | Status: DC | PRN
  Administered 2018-12-31: 100 mg via INTRAVENOUS

## 2018-12-31 MED ORDER — PANTOPRAZOLE SODIUM 40 MG PO TBEC: 40.0000 mg | DELAYED_RELEASE_TABLET | Freq: Every day | ORAL | Status: DC

## 2018-12-31 MED ORDER — BUPROPION HCL ER (XL) 300 MG PO TB24: 300.0000 mg | ORAL_TABLET | Freq: Every day | ORAL | Status: DC

## 2018-12-31 MED ORDER — SUGAMMADEX SODIUM 200 MG/2ML IV SOLN
INTRAVENOUS | Status: AC
  Filled 2018-12-31: qty 2

## 2018-12-31 MED ORDER — ACETAMINOPHEN 500 MG PO TABS
1000.0000 mg | ORAL_TABLET | ORAL | Status: AC
  Administered 2018-12-31: 1000 mg via ORAL

## 2018-12-31 MED ORDER — SODIUM CHLORIDE 0.9 % IR SOLN
Status: DC | PRN
  Administered 2018-12-31: 4000 mL

## 2018-12-31 MED ORDER — SODIUM CHLORIDE 0.9 % IV SOLN
INTRAVENOUS | Status: DC
  Administered 2018-12-31: 16:00:00 via INTRAVENOUS

## 2018-12-31 MED ORDER — MORPHINE SULFATE (PF) 4 MG/ML IV SOLN
1.0000 mg | INTRAVENOUS | Status: DC | PRN
  Administered 2018-12-31 – 2019-01-01 (×2): 1 mg via INTRAVENOUS
  Filled 2018-12-31: qty 1

## 2018-12-31 MED ORDER — ACETAMINOPHEN 500 MG PO TABS
1000.0000 mg | ORAL_TABLET | Freq: Four times a day (QID) | ORAL | Status: DC
  Administered 2018-12-31 – 2019-01-01 (×3): 1000 mg via ORAL
  Filled 2018-12-31 (×2): qty 2

## 2018-12-31 MED ORDER — DEXAMETHASONE SODIUM PHOSPHATE 4 MG/ML IJ SOLN
INTRAMUSCULAR | Status: DC | PRN
  Administered 2018-12-31: 10 mg via INTRAVENOUS

## 2018-12-31 MED ORDER — GABAPENTIN 100 MG PO CAPS
100.0000 mg | ORAL_CAPSULE | ORAL | Status: AC
  Administered 2018-12-31: 100 mg via ORAL

## 2018-12-31 MED ORDER — ACETAMINOPHEN 500 MG PO TABS
ORAL_TABLET | ORAL | Status: AC
  Filled 2018-12-31: qty 2

## 2018-12-31 MED ORDER — ROCURONIUM BROMIDE 100 MG/10ML IV SOLN
INTRAVENOUS | Status: DC | PRN
  Administered 2018-12-31: 30 mg via INTRAVENOUS

## 2018-12-31 MED ORDER — KETOROLAC TROMETHAMINE 15 MG/ML IJ SOLN
15.0000 mg | Freq: Four times a day (QID) | INTRAMUSCULAR | Status: DC | PRN
  Administered 2018-12-31: 15 mg via INTRAVENOUS
  Filled 2018-12-31: qty 1

## 2018-12-31 MED ORDER — BUPIVACAINE HCL (PF) 0.5 % IJ SOLN
INTRAMUSCULAR | Status: DC | PRN
  Administered 2018-12-31: 30 mL via PERINEURAL

## 2018-12-31 MED ORDER — OXYCODONE HCL 5 MG PO TABS
5.0000 mg | ORAL_TABLET | ORAL | Status: DC | PRN
  Administered 2018-12-31 – 2019-01-01 (×4): 5 mg via ORAL
  Filled 2018-12-31 (×4): qty 1

## 2018-12-31 MED ORDER — FENTANYL CITRATE (PF) 100 MCG/2ML IJ SOLN
50.0000 ug | INTRAMUSCULAR | Status: DC | PRN
  Administered 2018-12-31: 100 ug via INTRAVENOUS

## 2018-12-31 MED ORDER — BUPIVACAINE HCL (PF) 0.25 % IJ SOLN
INTRAMUSCULAR | Status: AC
  Filled 2018-12-31: qty 30

## 2018-12-31 MED ORDER — CIPROFLOXACIN IN D5W 400 MG/200ML IV SOLN
400.0000 mg | INTRAVENOUS | Status: AC
  Administered 2018-12-31: 400 mg via INTRAVENOUS

## 2018-12-31 MED ORDER — SODIUM CHLORIDE (PF) 0.9 % IJ SOLN
INTRAMUSCULAR | Status: AC
  Filled 2018-12-31: qty 10

## 2018-12-31 MED ORDER — MIDAZOLAM HCL 2 MG/2ML IJ SOLN
1.0000 mg | INTRAMUSCULAR | Status: DC | PRN
  Administered 2018-12-31: 2 mg via INTRAVENOUS

## 2018-12-31 MED ORDER — CLONIDINE HCL (ANALGESIA) 100 MCG/ML EP SOLN
EPIDURAL | Status: DC | PRN
  Administered 2018-12-31: 100 ug

## 2018-12-31 MED ORDER — ONDANSETRON 4 MG PO TBDP: 4.0000 mg | ORAL_TABLET | Freq: Four times a day (QID) | ORAL | Status: DC | PRN

## 2018-12-31 MED ORDER — MIDAZOLAM HCL 2 MG/2ML IJ SOLN
INTRAMUSCULAR | Status: AC
  Filled 2018-12-31: qty 2

## 2018-12-31 MED ORDER — ONDANSETRON HCL 4 MG/2ML IJ SOLN: 4.0000 mg | Freq: Four times a day (QID) | INTRAMUSCULAR | Status: DC | PRN

## 2018-12-31 MED ORDER — ROCURONIUM BROMIDE 50 MG/5ML IV SOSY
PREFILLED_SYRINGE | INTRAVENOUS | Status: AC
  Filled 2018-12-31: qty 5

## 2018-12-31 MED ORDER — SUGAMMADEX SODIUM 200 MG/2ML IV SOLN
INTRAVENOUS | Status: DC | PRN
  Administered 2018-12-31: 200 mg via INTRAVENOUS

## 2018-12-31 MED ORDER — DEXAMETHASONE SODIUM PHOSPHATE 10 MG/ML IJ SOLN
INTRAMUSCULAR | Status: AC
  Filled 2018-12-31: qty 1

## 2018-12-31 MED ORDER — ONDANSETRON HCL 4 MG/2ML IJ SOLN
INTRAMUSCULAR | Status: DC | PRN
  Administered 2018-12-31: 4 mg via INTRAVENOUS

## 2018-12-31 MED ORDER — ONDANSETRON HCL 4 MG/2ML IJ SOLN
INTRAMUSCULAR | Status: AC
  Filled 2018-12-31: qty 2

## 2018-12-31 MED ORDER — SIMETHICONE 80 MG PO CHEW: 40.0000 mg | CHEWABLE_TABLET | Freq: Four times a day (QID) | ORAL | Status: DC | PRN

## 2018-12-31 MED ORDER — EPHEDRINE SULFATE 50 MG/ML IJ SOLN
INTRAMUSCULAR | Status: DC | PRN
  Administered 2018-12-31 (×2): 10 mg via INTRAVENOUS

## 2018-12-31 MED ORDER — PROMETHAZINE HCL 25 MG/ML IJ SOLN
INTRAMUSCULAR | Status: AC
  Filled 2018-12-31: qty 1

## 2018-12-31 MED ORDER — SCOPOLAMINE 1 MG/3DAYS TD PT72: 1.0000 | MEDICATED_PATCH | Freq: Once | TRANSDERMAL | Status: DC | PRN

## 2018-12-31 MED ORDER — CIPROFLOXACIN IN D5W 400 MG/200ML IV SOLN
INTRAVENOUS | Status: AC
  Filled 2018-12-31: qty 200

## 2018-12-31 MED ORDER — KETOROLAC TROMETHAMINE 15 MG/ML IJ SOLN
15.0000 mg | INTRAMUSCULAR | Status: AC
  Administered 2018-12-31: 15 mg via INTRAVENOUS

## 2018-12-31 SURGICAL SUPPLY — 56 items
ADH SKN CLS APL DERMABOND .7 (GAUZE/BANDAGES/DRESSINGS) ×1
APPLIER CLIP 5 13 M/L LIGAMAX5 (MISCELLANEOUS) ×3
APR CLP MED LRG 5 ANG JAW (MISCELLANEOUS) ×1
BAG SPEC RTRVL 10 TROC 200 (ENDOMECHANICALS) ×1
BIOPATCH RED 1 DISK 7.0 (GAUZE/BANDAGES/DRESSINGS) ×1 IMPLANT
BIOPATCH RED 1IN DISK 7.0MM (GAUZE/BANDAGES/DRESSINGS) ×1
BLADE CLIPPER SURG (BLADE) IMPLANT
CANISTER SUCT 1200ML W/VALVE (MISCELLANEOUS) ×9 IMPLANT
CHLORAPREP W/TINT 26ML (MISCELLANEOUS) ×1 IMPLANT
CLIP APPLIE 5 13 M/L LIGAMAX5 (MISCELLANEOUS) ×1 IMPLANT
CLOSURE WOUND 1/2 X4 (GAUZE/BANDAGES/DRESSINGS) ×1
COVER MAYO STAND STRL (DRAPES) IMPLANT
COVER WAND RF STERILE (DRAPES) IMPLANT
DECANTER SPIKE VIAL GLASS SM (MISCELLANEOUS) IMPLANT
DERMABOND ADVANCED (GAUZE/BANDAGES/DRESSINGS) ×2
DERMABOND ADVANCED .7 DNX12 (GAUZE/BANDAGES/DRESSINGS) ×1 IMPLANT
DEVICE TROCAR PUNCTURE CLOSURE (ENDOMECHANICALS) IMPLANT
DRAPE C-ARM 42X72 X-RAY (DRAPES) IMPLANT
DRAPE LAPAROSCOPIC ABDOMINAL (DRAPES) ×1 IMPLANT
DRSG TEGADERM 4X4.75 (GAUZE/BANDAGES/DRESSINGS) ×2 IMPLANT
ELECT REM PT RETURN 9FT ADLT (ELECTROSURGICAL) ×3
ELECTRODE REM PT RTRN 9FT ADLT (ELECTROSURGICAL) ×1 IMPLANT
EVACUATOR SILICONE 100CC (DRAIN) ×2 IMPLANT
FILTER SMOKE EVAC LAPAROSHD (FILTER) IMPLANT
GLOVE BIO SURGEON STRL SZ7 (GLOVE) ×3 IMPLANT
GLOVE BIOGEL PI IND STRL 7.5 (GLOVE) ×1 IMPLANT
GLOVE BIOGEL PI INDICATOR 7.5 (GLOVE) ×2
GOWN STRL REUS W/ TWL LRG LVL3 (GOWN DISPOSABLE) ×3 IMPLANT
GOWN STRL REUS W/TWL LRG LVL3 (GOWN DISPOSABLE) ×9
GRASPER SUT TROCAR 14GX15 (MISCELLANEOUS) ×2 IMPLANT
HEMOSTAT SNOW SURGICEL 2X4 (HEMOSTASIS) IMPLANT
NS IRRIG 1000ML POUR BTL (IV SOLUTION) ×7 IMPLANT
PACK BASIN DAY SURGERY FS (CUSTOM PROCEDURE TRAY) ×3 IMPLANT
POUCH RETRIEVAL ECOSAC 10 (ENDOMECHANICALS) ×1 IMPLANT
POUCH RETRIEVAL ECOSAC 10MM (ENDOMECHANICALS) ×2
SCISSORS LAP 5X35 DISP (ENDOMECHANICALS) ×3 IMPLANT
SET CHOLANGIOGRAPH 5 50 .035 (SET/KITS/TRAYS/PACK) IMPLANT
SET IRRIG TUBING LAPAROSCOPIC (IRRIGATION / IRRIGATOR) ×3 IMPLANT
SLEEVE ENDOPATH XCEL 5M (ENDOMECHANICALS) ×6 IMPLANT
SLEEVE SCD COMPRESS KNEE MED (MISCELLANEOUS) ×3 IMPLANT
SPECIMEN JAR SMALL (MISCELLANEOUS) ×1 IMPLANT
STRIP CLOSURE SKIN 1/2X4 (GAUZE/BANDAGES/DRESSINGS) ×2 IMPLANT
SUT ETHILON 2 0 FS 18 (SUTURE) ×2 IMPLANT
SUT MNCRL AB 4-0 PS2 18 (SUTURE) ×3 IMPLANT
SUT VICRYL 0 UR6 27IN ABS (SUTURE) IMPLANT
SUT VICRYL AB 2 0 TIE (SUTURE) IMPLANT
SUT VICRYL AB 2 0 TIES (SUTURE) ×3
TOWEL GREEN STERILE FF (TOWEL DISPOSABLE) ×6 IMPLANT
TRAY DSU PREP LF (CUSTOM PROCEDURE TRAY) ×2 IMPLANT
TRAY LAPAROSCOPIC (CUSTOM PROCEDURE TRAY) ×3 IMPLANT
TROCAR XCEL BLUNT TIP 100MML (ENDOMECHANICALS) ×3 IMPLANT
TROCAR XCEL NON-BLD 11X100MML (ENDOMECHANICALS) ×2 IMPLANT
TROCAR XCEL NON-BLD 5MMX100MML (ENDOMECHANICALS) ×3 IMPLANT
TUBE CONNECTING 20'X1/4 (TUBING) ×1
TUBE CONNECTING 20X1/4 (TUBING) ×2 IMPLANT
TUBING INSUFFLATION (TUBING) ×3 IMPLANT

## 2018-12-31 NOTE — Anesthesia Procedure Notes (Signed)
Procedure Name: Intubation Performed by: York Grice, CRNA Pre-anesthesia Checklist: Patient identified, Emergency Drugs available, Suction available and Patient being monitored Patient Re-evaluated:Patient Re-evaluated prior to induction Oxygen Delivery Method: Circle system utilized Preoxygenation: Pre-oxygenation with 100% oxygen Induction Type: IV induction Ventilation: Mask ventilation without difficulty Laryngoscope Size: Miller and 2 Grade View: Grade II Tube type: Oral Number of attempts: 1 Airway Equipment and Method: Stylet and Oral airway Placement Confirmation: ETT inserted through vocal cords under direct vision,  positive ETCO2 and breath sounds checked- equal and bilateral Tube secured with: Tape Dental Injury: Teeth and Oropharynx as per pre-operative assessment

## 2018-12-31 NOTE — Op Note (Signed)
Preoperative diagnosis: acute cholecystitis Postoperative diagnosis: gangrenous cholecystitis Procedure: Laparoscopic cholecystectomy Surgeon: Dr. Harden Mo Anesthesia: General with tap block Estimated blood loss: Minimal Complications: None Drains: None Specimens: Gallbladder and contents to pathology Sponge and count was correct at completion Disposition to recovery in stable condition  Indications: This a 77 yof with three weeks of ruq pain and stones on an [place].  I saw her Tuesday and she was tender. I recommended lap chole asap.  We discussed risks prior to beginning.   Procedure: After informed consent was obtained the patient was taken to the operating room. She was given antibiotics and she had SCDs in place. She was then placed under general anesthesia without complication. She was prepped and draped in standard sterile surgical fashion. A surgical timeout was then performed.  I infiltrated Marcaine below the umbilicus. I made a vertical incision. I used a Regulatory affairs officer the fascia. I then incised the fascia and entered into the abdomen bluntly. There was no entry injury. I then placed a 0 Vicryl pursestring suture through the fascia. I then placed a Hassan trocar. The abdomen was then insufflated to 15 mmHg pressure. I then inserted 3 further 5 mm trocars in the epigastrium and right side of the abdomen under direct vision without complication. she did have acute cholecystitis.  the gallbladder was actually gangrenous.  I first had to aspirate it to retract it. Pus was present.  I was then able to retract it cephalad and lateral. It was very fibrotic and difficult. The gallbladder was gangrenous and a stone began protruding from the base.  I dissected the triangle and was able to clearly obtain the critical view of safety eventually. I clipped the artery and divided it. I clipped the cystic duct and leaving 3 clips in place divided that as well. The clips completely  traversed the duct. I then removedthe gallbladder from the liver bed. I entered into it and left some of the back wall present. I sized the epigastric trocar up to an 54mm to evacuate stones.   I spilled stones. I retrieved as many of these as I could identify.  I cauterized the remaining mucosa.   I then placed it in a bag and removed it. I then irrigated till this was clear.I then removed the [facility] trocar and tied the pursestring down. I placed an additional 0 Vicryl suture using the suture passer device. I also closed the epigastric trocar with the suture device and a 0 vicryl. I placed a blake drain through the right lateral trocar and secured it with a 2-0 nylon due to my concern over the cystic duct remnant. This was close to the cbd and I didn't think anything more would be safe than the clips. The remaining trocars were then removed. I closed these with 4-0 Monocryl and glue. She tolerated this well was extubated and transferred to recovery stable.

## 2018-12-31 NOTE — H&P (Signed)
48 yof referred by Dr Dulce Sellar for gallstones. she is otherwise healthy. since jan 10 she has had worsening upper abd pain mostly on right side associated with bloating. had a couple days recently that she felt fine. she had [place] that showed gallstones no thickening or fluid. her labs were all normal. not eating much of anything due to pain. some constipation she has allergy to chg. she is here to discuss gallstones  Past Surgical History Ethlyn Gallery, CMA; 12/29/2018 11:30 AM) Cesarean Section - Multiple   Diagnostic Studies History Ethlyn Gallery, CMA; 12/29/2018 11:30 AM) Colonoscopy  never Mammogram  1-3 years ago Pap Smear  1-5 years ago  Allergies Ethlyn Gallery, CMA; 12/29/2018 11:33 AM) Cephalexin *CEPHALOSPORINS*  Chlorhexidine *PHARMACEUTICAL ADJUVANTS*  Dicloxacillin Sodium *PENICILLINS*  Lidocaine *CHEMICALS*  Tetracycline HCl *DERMATOLOGICALS*   Medication History (Alisha Spillers, CMA; 12/29/2018 11:33 AM) buPROPion HCl ER (XL) (300MG  Tablet ER 24HR, Oral) Active. Omeprazole (40MG  Capsule DR, Oral) Active. Medications Reconciled  Social History Ethlyn Gallery, CMA; 12/29/2018 11:30 AM) Alcohol use  Moderate alcohol use. Caffeine use  Coffee. No drug use  Tobacco use  Never smoker.  Family History Ethlyn Gallery, CMA; 12/29/2018 11:30 AM) Diabetes Mellitus  Father.  Pregnancy / Birth History Ethlyn Gallery, CMA; 12/29/2018 11:30 AM) Age at menarche  12 years. Contraceptive History  Intrauterine device. Gravida  2 Length (months) of breastfeeding  12-24 Maternal age  80-35 Para  2 Regular periods   Other Problems Ethlyn Gallery, CMA; 12/29/2018 11:30 AM) Anxiety Disorder  Cholelithiasis  Gastroesophageal Reflux Disease   Review of Systems (Alisha Spillers CMA; 12/29/2018 11:30 AM) General Present- Appetite Loss. Not Present- Chills, Fatigue, Fever, Night Sweats, Weight Gain and Weight Loss. Skin Not Present- Change in Wart/Mole,  Dryness, Hives, Jaundice, New Lesions, Non-Healing Wounds, Rash and Ulcer. HEENT Present- Hearing Loss, Ringing in the Ears and Seasonal Allergies. Not Present- Earache, Hoarseness, Nose Bleed, Oral Ulcers, Sinus Pain, Sore Throat, Visual Disturbances, Wears glasses/contact lenses and Yellow Eyes. Respiratory Present- Snoring. Not Present- Bloody sputum, Chronic Cough, Difficulty Breathing and Wheezing. Breast Not Present- Breast Mass, Breast Pain, Nipple Discharge and Skin Changes. Cardiovascular Not Present- Chest Pain, Difficulty Breathing Lying Down, Leg Cramps, Palpitations, Rapid Heart Rate, Shortness of Breath and Swelling of Extremities. Gastrointestinal Present- Abdominal Pain, Bloating, Change in Bowel Habits and Constipation. Not Present- Bloody Stool, Chronic diarrhea, Difficulty Swallowing, Excessive gas, Gets full quickly at meals, Hemorrhoids, Indigestion, Nausea, Rectal Pain and Vomiting. Female Genitourinary Not Present- Frequency, Nocturia, Painful Urination, Pelvic Pain and Urgency. Musculoskeletal Not Present- Back Pain, Joint Pain, Joint Stiffness, Muscle Pain, Muscle Weakness and Swelling of Extremities. Neurological Present- Tingling. Not Present- Decreased Memory, Fainting, Headaches, Numbness, Seizures, Tremor, Trouble walking and Weakness. Psychiatric Present- Anxiety. Not Present- Bipolar, Change in Sleep Pattern, Depression, Fearful and Frequent crying. Endocrine Not Present- Cold Intolerance, Excessive Hunger, Hair Changes, Heat Intolerance, Hot flashes and New Diabetes. Hematology Not Present- Blood Thinners, Easy Bruising, Excessive bleeding, Gland problems, HIV and Persistent Infections.  Vitals (Alisha Spillers CMA; 12/29/2018 11:31 AM) 12/29/2018 11:31 AM Weight: 155 lb Height: 62.5in Body Surface Area: 1.73 m Body Mass Index: 27.9 kg/m  Pulse: 77 (Regular)  BP: 108/78 (Sitting, Left Arm, Standard Physical Exam [name] MD; 12/29/2018 12:19  PM) General Mental Status-Alert. Head and Neck Trachea-midline. Thyroid Gland Characteristics - normal size and consistency. Eye Sclera/Conjunctiva - Bilateral-No scleral icterus. Chest and Lung Exam Chest and lung exam reveals -quiet, even and easy respiratory effort with no use of accessory muscles and  on auscultation, normal breath sounds, no adventitious sounds and normal vocal resonance. Cardiovascular Cardiovascular examination reveals -normal heart sounds, regular rate and rhythm with no murmurs. Abdomen Note: soft nondistended tender ruq to palpation Neurologic Neurologic evaluation reveals -alert and oriented x 3 with no impairment of recent or remote memory.  Assessment & Plan [name] MD; 12/29/2018 12:20 PM) GALLSTONES (K80.20) Story: Lap chole her symptoms are certainly from her gallbladder. I think she needs surgery soon as possible. I discussed the procedure in detail. We discussed the risks and benefits of a laparoscopic cholecystectomy and possible cholangiogram including, but not limited to bleeding, infection, injury to surrounding structures such as the intestine or liver, bile leak, retained gallstones, need to convert to an open procedure, prolonged diarrhea, blood clots such as DVT, common bile duct injury, anesthesia risks, and possible need for additional procedures. The likelihood of improvement in symptoms and return to the patient's normal status is good. We discussed the typical post-operative recovery course.

## 2018-12-31 NOTE — Transfer of Care (Signed)
Immediate Anesthesia Transfer of Care Note  Patient: Helen Lowe  Procedure(s) Performed: LAPAROSCOPIC CHOLECYSTECTOMY (N/A Abdomen)  Patient Location: PACU  Anesthesia Type:General and Regional  Level of Consciousness: awake and sedated  Airway & Oxygen Therapy: Patient Spontanous Breathing and Patient connected to face mask oxygen  Post-op Assessment: Report given to RN and Post -op Vital signs reviewed and stable  Post vital signs: Reviewed and stable  Last Vitals:  Vitals Value Taken Time  BP 122/71 12/31/2018  2:39 PM  Temp    Pulse 72 12/31/2018  2:42 PM  Resp 15 12/31/2018  2:42 PM  SpO2 100 % 12/31/2018  2:42 PM  Vitals shown include unvalidated device data.  Last Pain:  Vitals:   12/31/18 1144  TempSrc:   PainSc: 0-No pain      Patients Stated Pain Goal: 3 (12/31/18 1131)  Complications: No apparent anesthesia complications

## 2018-12-31 NOTE — Anesthesia Procedure Notes (Signed)
Anesthesia Regional Block: TAP block   Pre-Anesthetic Checklist: ,, timeout performed, Correct Patient, Correct Site, Correct Laterality, Correct Procedure, Correct Position, site marked, Risks and benefits discussed,  Surgical consent,  Pre-op evaluation,  At surgeon's request and post-op pain management  Laterality: Right  Prep: Maximum Sterile Barrier Precautions used, chloraprep       Needles:  Injection technique: Single-shot  Needle Type: Echogenic Stimulator Needle     Needle Length: 9cm  Needle Gauge: 22     Additional Needles:   Procedures:,,,, ultrasound used (permanent image in chart),,,,  Narrative:  Start time: 12/31/2018 12:13 PM End time: 12/31/2018 12:23 PM Injection made incrementally with aspirations every 5 mL.  Performed by: Personally  Anesthesiologist: Elmer Picker, MD  Additional Notes: Monitors applied. No increased pain on injection. No increased resistance to injection. Injection made in 5cc increments. Good needle visualization. Patient tolerated procedure well.

## 2018-12-31 NOTE — Anesthesia Preprocedure Evaluation (Addendum)
Anesthesia Evaluation  Patient identified by MRN, date of birth, ID band Patient awake    Reviewed: Allergy & Precautions, NPO status , Patient's Chart, lab work & pertinent test results  Airway Mallampati: III  TM Distance: >3 FB Neck ROM: Full  Mouth opening: Limited Mouth Opening  Dental no notable dental hx. (+) Teeth Intact, Dental Advisory Given   Pulmonary neg pulmonary ROS,    Pulmonary exam normal breath sounds clear to auscultation       Cardiovascular negative cardio ROS Normal cardiovascular exam Rhythm:Regular Rate:Normal     Neuro/Psych PSYCHIATRIC DISORDERS Anxiety negative neurological ROS     GI/Hepatic Neg liver ROS, GERD  Medicated and Controlled,  Endo/Other  negative endocrine ROS  Renal/GU negative Renal ROS  negative genitourinary   Musculoskeletal negative musculoskeletal ROS (+)   Abdominal   Peds  Hematology negative hematology ROS (+)   Anesthesia Other Findings   Reproductive/Obstetrics negative OB ROS                            Anesthesia Physical Anesthesia Plan  ASA: II  Anesthesia Plan: General and Regional   Post-op Pain Management:  Regional for Post-op pain   Induction: Intravenous  PONV Risk Score and Plan: 3 and Ondansetron, Dexamethasone and Midazolam  Airway Management Planned: Oral ETT  Additional Equipment:   Intra-op Plan:   Post-operative Plan: Extubation in OR  Informed Consent: I have reviewed the patients History and Physical, chart, labs and discussed the procedure including the risks, benefits and alternatives for the proposed anesthesia with the patient or authorized representative who has indicated his/her understanding and acceptance.     Dental advisory given  Plan Discussed with: CRNA  Anesthesia Plan Comments:        Anesthesia Quick Evaluation

## 2018-12-31 NOTE — Interval H&P Note (Signed)
History and Physical Interval Note:  12/31/2018 12:02 PM  Helen Lowe  has presented today for surgery, with the diagnosis of Gallstones  The various methods of treatment have been discussed with the patient and family. After consideration of risks, benefits and other options for treatment, the patient has consented to  Procedure(s) with comments: LAPAROSCOPIC CHOLECYSTECTOMY ERAS PATHWAY (N/A) - TAP BLOCK as a surgical intervention .  The patient's history has been reviewed, patient examined, no change in status, stable for surgery.  I have reviewed the patient's chart and labs.  Questions were answered to the patient's satisfaction.     [name]

## 2018-12-31 NOTE — Anesthesia Postprocedure Evaluation (Signed)
Anesthesia Post Note  Patient: Helen Lowe  Procedure(s) Performed: LAPAROSCOPIC CHOLECYSTECTOMY (N/A Abdomen)     Patient location during evaluation: PACU Anesthesia Type: General Level of consciousness: awake and alert Pain management: pain level controlled Vital Signs Assessment: post-procedure vital signs reviewed and stable Respiratory status: spontaneous breathing, nonlabored ventilation, respiratory function stable and patient connected to nasal cannula oxygen Cardiovascular status: blood pressure returned to baseline and stable Postop Assessment: no apparent nausea or vomiting Anesthetic complications: no    Last Vitals:  Vitals:   12/31/18 1545 12/31/18 1600  BP: 117/71 113/80  Pulse: 74 69  Resp: 17 19  Temp:    SpO2: 99% 97%    Last Pain:  Vitals:   12/31/18 1600  TempSrc:   PainSc: 3                  Trevor Iha

## 2018-12-31 NOTE — Progress Notes (Signed)
Assisted D. Woodrum with right, ultrasound guided, transabdominal plane block. Side rails up, monitors on throughout procedure. See vital signs in flow sheet. Tolerated Procedure well.

## 2018-12-31 NOTE — Brief Op Note (Signed)
12/31/2018  2:30 PM  PATIENT:  Helen Lowe  48 y.o. female  PRE-OPERATIVE DIAGNOSIS:  Gallstones  POST-OPERATIVE DIAGNOSIS:  Gangrenous cholecystitis  PROCEDURE:  Procedure(s): LAPAROSCOPIC CHOLECYSTECTOMY (N/A)  SURGEON:  Surgeon(s) and Role:    [name]* Dierdra Salameh, MD - Primary  PHYSICIAN ASSISTANT:   ASSISTANTS: none   ANESTHESIA:   general with tap block  EBL:  10 mL   BLOOD ADMINISTERED:none  DRAINS: (19 Fr) Jackson-Pratt drain(s) with closed bulb suction in the ruq   LOCAL MEDICATIONS USED:  MARCAINE     SPECIMEN:  Source of Specimen:  gallbladder  DISPOSITION OF SPECIMEN:  PATHOLOGY  COUNTS:  YES  TOURNIQUET:  * No tourniquets in log *  DICTATION: .Dragon Dictation  PLAN OF CARE: Admit for overnight observation  PATIENT DISPOSITION:  PACU - hemodynamically stable.   Delay start of Pharmacological VTE agent (>24hrs) due to surgical blood loss or risk of bleeding: not applicable

## 2019-01-01 DIAGNOSIS — K8010 Calculus of gallbladder with chronic cholecystitis without obstruction: Secondary | ICD-10-CM | POA: Diagnosis not present

## 2019-01-01 MED ORDER — OXYCODONE HCL 5 MG PO TABS: 5.0000 mg | ORAL_TABLET | Freq: Four times a day (QID) | ORAL | 0 refills | Status: DC | PRN

## 2019-01-01 NOTE — Discharge Instructions (Signed)
1,000mg  Tylenol given at 6:30am Oxycodone given at 7:00am Robaxin given at 7:00am                                  [facility] LAPAROSCOPIC SURGERY: POST OP INSTRUCTIONS  Always review your discharge instruction sheet given to you by the facility where your surgery was performed. IF YOU HAVE DISABILITY OR FAMILY LEAVE FORMS, YOU MUST BRING THEM TO THE OFFICE FOR PROCESSING.   DO NOT GIVE THEM TO YOUR DOCTOR.  1. A prescription for pain medication may be given to you upon discharge.  Take your pain medication as prescribed, if needed.  If narcotic pain medicine is not needed, then you may take acetaminophen (Tylenol), naprosyn (Alleve), or ibuprofen (Advil) as needed. 2. Take your usually prescribed medications unless otherwise directed. 3. If you need a refill on your pain medication, please contact your pharmacy.  They will contact our office to request authorization. Prescriptions will not be filled after 5pm or on week-ends. 4. You should follow a light diet the first few days after arrival home, such as soup and crackers, etc.  Be sure to include lots of fluids daily. 5. Most patients will experience some swelling and bruising in the area of the incisions.  Ice packs will help.  Swelling and bruising can take several days to resolve.  6. It is common to experience some constipation if taking pain medication after surgery.  Increasing fluid intake and taking a stool softener (such as Colace) will usually help or prevent this problem from occurring.  A mild laxative (Milk of Magnesia or Miralax) should be taken according to package instructions if there are no bowel movements after 48 hours. 7. Unless discharge instructions indicate otherwise, you may remove your bandages 48 hours after surgery, and you may shower at that time.  You may have steri-strips (small skin tapes) in place directly over the incision.  These strips should be left on the skin for 7-10 days.  If your  surgeon used skin glue on the incision, you may shower in 24 hours.  The glue will flake off over the next 2-3 weeks.  Any sutures or staples will be removed at the office during your follow-up visit. 8. ACTIVITIES:  You may resume regular (light) daily activities beginning the next day--such as daily self-care, walking, climbing stairs--gradually increasing activities as tolerated.  You may have sexual intercourse when it is comfortable.  Refrain from any heavy lifting or straining until approved by your doctor. a. You may drive when you are no longer taking prescription pain medication, you can comfortably wear a seatbelt, and you can safely maneuver your car and apply brakes. b. RETURN TO WORK:  __________________________________________________________ 9. You should see your doctor in the office for a follow-up appointment approximately 2-3 weeks after your surgery.  Make sure that you call for this appointment within a day or two after you arrive home to insure a convenient appointment time. 10. OTHER INSTRUCTIONS: __________________________________________________________________________________________________________________________ __________________________________________________________________________________________________________________________ WHEN TO CALL YOUR DOCTOR: 1. Fever over 101.0 2. Inability to urinate 3. Continued bleeding from incision. 4. Increased pain, redness, or drainage from the incision. 5. Increasing abdominal pain  The clinic staff is available to answer your questions during regular business hours.  Please dont hesitate to call and ask to speak to one of the nurses for clinical concerns.  If you have a medical emergency, go to the  nearest emergency room or call 911.  [facility] is always on call at the hospital. [address], [address], [place], [place]  [address] ? [address] [address], [place], [place]   [id number] [phone] ?  [phone] ? FAX [phone] Web site: [email or web address]   Post Anesthesia Home Care Instructions  Activity: Get plenty of rest for the remainder of the day. A responsible individual must stay with you for 24 hours following the procedure.  For the next 24 hours, DO NOT: -Drive a car -Advertising copywriterperate machinery -Drink alcoholic beverages -Take any medication unless instructed by your physician -Make any legal decisions or sign important papers.  Meals: Start with liquid foods such as gelatin or soup. Progress to regular foods as tolerated. Avoid greasy, spicy, heavy foods. If nausea and/or vomiting occur, drink only clear liquids until the nausea and/or vomiting subsides. Call your physician if vomiting continues.  Special Instructions/Symptoms: Your throat may feel dry or sore from the anesthesia or the breathing tube placed in your throat during surgery. If this causes discomfort, gargle with warm salt water. The discomfort should disappear within 24 hours.  If you had a scopolamine patch placed behind your ear for the management of post- operative nausea and/or vomiting:  1. The medication in the patch is effective for 72 hours, after which it should be removed.  Wrap patch in a tissue and discard in the trash. Wash hands thoroughly with soap and water. 2. You may remove the patch earlier than 72 hours if you experience unpleasant side effects which may include dry mouth, dizziness or visual disturbances. 3. Avoid touching the patch. Wash your hands with soap and water after contact with the patch.    About my Jackson-Pratt Bulb Drain  What is a Jackson-Pratt bulb? A Jackson-Pratt is a soft, round device used to collect drainage. It is connected to a long, thin drainage catheter, which is held in place by one or two small stiches near your surgical incision site. When the bulb is squeezed, it forms a vacuum, forcing the drainage to empty into the bulb.  Emptying the  Jackson-Pratt bulb- To empty the bulb: 1. Release the plug on the top of the bulb. 2. Pour the bulb's contents into a measuring container which your nurse will provide. 3. Record the time emptied and amount of drainage. Empty the drain(s) as often as your     doctor or nurse recommends.  Date                  Time                    Amount (Drain 1)                 Amount (Drain 2)  _____________________________________________________________________  _____________________________________________________________________  _____________________________________________________________________  _____________________________________________________________________  _____________________________________________________________________  _____________________________________________________________________  _____________________________________________________________________  _____________________________________________________________________  Squeezing the Jackson-Pratt Bulb- To squeeze the bulb: 1. Make sure the plug at the top of the bulb is open. 2. Squeeze the bulb tightly in your fist. You will hear air squeezing from the bulb. 3. Replace the plug while the bulb is squeezed. 4. Use a safety pin to attach the bulb to your clothing. This will keep the catheter from     pulling at the bulb insertion site.  When to call your doctor- Call your doctor if:  Drain site becomes red, swollen or hot.  You have a fever greater than 101 degrees F.  There is oozing at the drain site.  Drain falls out (apply a guaze bandage over the drain hole and secure it with tape).  Drainage increases daily not related to activity patterns. (You will usually have more drainage when you are active than when you are resting.)  Drainage has a bad odor.

## 2019-01-04 ENCOUNTER — Encounter (HOSPITAL_BASED_OUTPATIENT_CLINIC_OR_DEPARTMENT_OTHER): Payer: Self-pay | Admitting: General Surgery

## 2019-02-08 ENCOUNTER — Other Ambulatory Visit: Payer: Self-pay | Admitting: Family Medicine

## 2019-04-15 ENCOUNTER — Other Ambulatory Visit: Payer: Self-pay | Admitting: Family Medicine

## 2019-04-18 ENCOUNTER — Other Ambulatory Visit: Payer: Self-pay | Admitting: Family Medicine

## 2019-05-11 ENCOUNTER — Telehealth: Payer: Self-pay | Admitting: Physical Therapy

## 2019-05-11 NOTE — Telephone Encounter (Signed)
Copied from [place] [id number]. Topic: Appointment Scheduling - Transfer of Care >> May 11, 2019 12:58 PM Yvette Rack wrote: Pt is requesting to transfer FROM: Dr. Jonni Sanger Pt is requesting to transfer TO: Dr. Martinique Reason for requested transfer: Pt stated she would prefer to see another provider at a different location  Send CRM to patient's current PCP (transferring FROM).

## 2019-05-11 NOTE — Telephone Encounter (Signed)
See below

## 2019-05-12 NOTE — Telephone Encounter (Signed)
Pt is aware and verbalized understanding.

## 2019-05-12 NOTE — Telephone Encounter (Signed)
Pt states that current Wellbutrin dose is not working, wants to know if you can increase it or does she need to wait and speak with Dr. Martinique

## 2019-05-12 NOTE — Telephone Encounter (Signed)
[place] with me thank you.

## 2019-05-12 NOTE — Telephone Encounter (Signed)
Dose can't be increased. Should talk to Dr. Martinique about next/other options.  thanks

## 2019-05-17 ENCOUNTER — Other Ambulatory Visit: Payer: Self-pay | Admitting: Family Medicine

## 2020-07-27 IMAGING — DX DG ABDOMEN 1V
2 series · 2 of 2 positions shown · non-contrast
Comparison: CT abdomen and pelvis 12/29/2007

CLINICAL DATA: Upper abdominal pain with constipation for 1 week

EXAM:
ABDOMEN - 1 VIEW

[kub ap (1 of 2)]
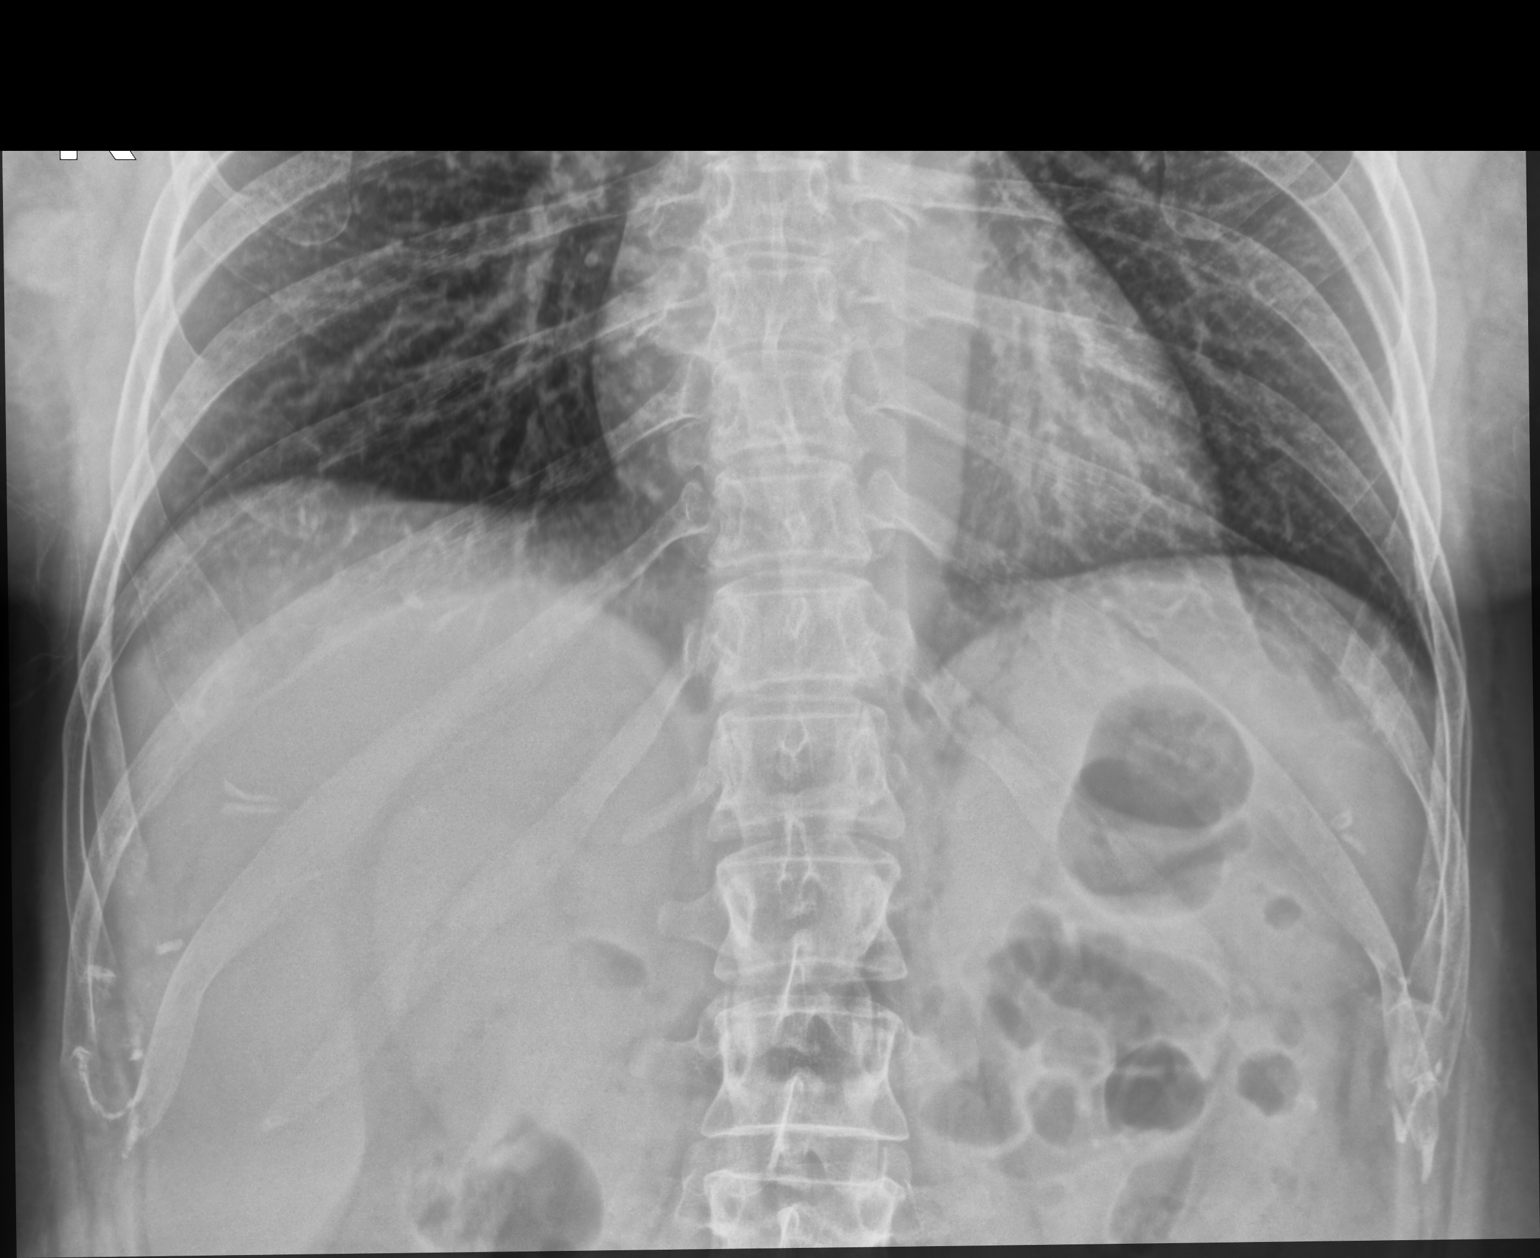

[kub ap (2 of 2)]
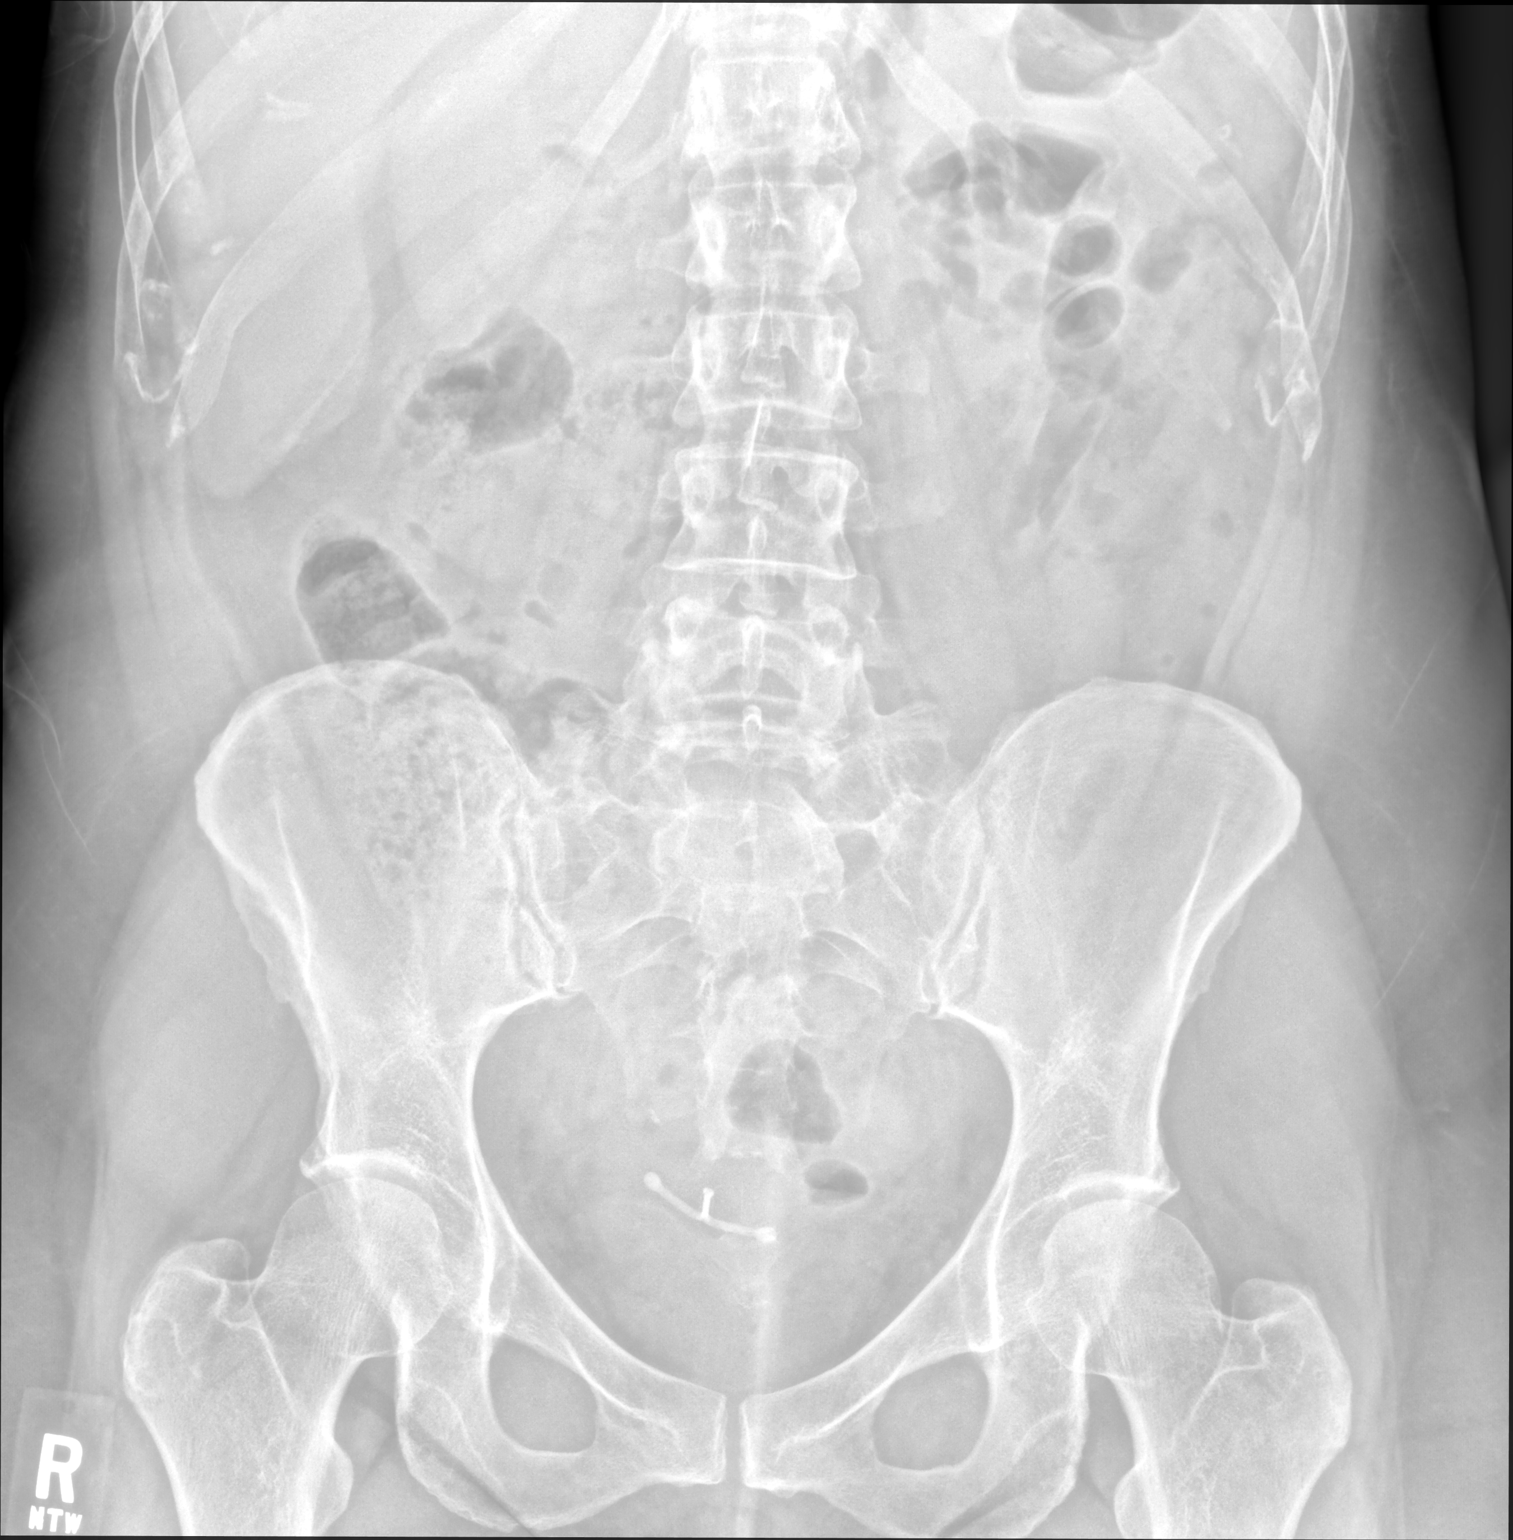

[2 of 2 positions shown; findings below may reference images not displayed]

FINDINGS: Lung bases clear.

Normal bowel gas pattern.

Normal retained stool burden.

IUD projects over pelvis.

Bones unremarkable.

No urinary tract calcification.
IMPRESSION: Normal exam.

## 2020-12-05 ENCOUNTER — Other Ambulatory Visit: Payer: Self-pay | Admitting: Obstetrics and Gynecology

## 2020-12-05 DIAGNOSIS — Z1231 Encounter for screening mammogram for malignant neoplasm of breast: Secondary | ICD-10-CM

## 2020-12-07 ENCOUNTER — Other Ambulatory Visit: Payer: Self-pay

## 2020-12-07 ENCOUNTER — Ambulatory Visit
Admission: RE | Admit: 2020-12-07 | Discharge: 2020-12-07 | Disposition: A | Discharge status: Home / Self Care | Payer: 59 | Source: Ambulatory Visit

## 2020-12-07 DIAGNOSIS — Z1231 Encounter for screening mammogram for malignant neoplasm of breast: Secondary | ICD-10-CM

## 2020-12-12 ENCOUNTER — Other Ambulatory Visit: Payer: Self-pay | Admitting: Obstetrics and Gynecology

## 2020-12-12 DIAGNOSIS — R928 Other abnormal and inconclusive findings on diagnostic imaging of breast: Secondary | ICD-10-CM

## 2020-12-13 ENCOUNTER — Ambulatory Visit
Admission: RE | Admit: 2020-12-13 | Discharge: 2020-12-13 | Disposition: A | Discharge status: Home / Self Care | Payer: 59 | Source: Ambulatory Visit | Attending: Obstetrics and Gynecology | Admitting: Obstetrics and Gynecology

## 2020-12-13 ENCOUNTER — Other Ambulatory Visit: Payer: Self-pay

## 2020-12-13 DIAGNOSIS — R928 Other abnormal and inconclusive findings on diagnostic imaging of breast: Secondary | ICD-10-CM

## 2020-12-22 ENCOUNTER — Other Ambulatory Visit: Payer: 59

## 2022-04-24 ENCOUNTER — Other Ambulatory Visit: Payer: Self-pay | Admitting: Family Medicine

## 2022-04-24 DIAGNOSIS — M8580 Other specified disorders of bone density and structure, unspecified site: Secondary | ICD-10-CM

## 2022-04-24 DIAGNOSIS — Z1231 Encounter for screening mammogram for malignant neoplasm of breast: Secondary | ICD-10-CM

## 2022-06-04 ENCOUNTER — Ambulatory Visit: Payer: 59

## 2022-06-07 ENCOUNTER — Ambulatory Visit
Admission: RE | Admit: 2022-06-07 | Discharge: 2022-06-07 | Disposition: A | Discharge status: Home / Self Care | Payer: 59 | Source: Ambulatory Visit | Attending: Family Medicine | Admitting: Family Medicine

## 2022-06-07 DIAGNOSIS — Z1231 Encounter for screening mammogram for malignant neoplasm of breast: Secondary | ICD-10-CM

## 2022-06-11 ENCOUNTER — Other Ambulatory Visit: Payer: Self-pay | Admitting: Family Medicine

## 2022-06-11 DIAGNOSIS — R928 Other abnormal and inconclusive findings on diagnostic imaging of breast: Secondary | ICD-10-CM

## 2022-07-03 ENCOUNTER — Ambulatory Visit
Admission: RE | Admit: 2022-07-03 | Discharge: 2022-07-03 | Disposition: A | Discharge status: Home / Self Care | Payer: 59 | Source: Ambulatory Visit | Attending: Family Medicine | Admitting: Family Medicine

## 2022-07-03 DIAGNOSIS — R928 Other abnormal and inconclusive findings on diagnostic imaging of breast: Secondary | ICD-10-CM

## 2022-07-30 IMAGING — MG MM DIGITAL DIAGNOSTIC UNILAT*L* W/ TOMO W/ CAD
4 series · 4 of 12 positions shown · non-contrast
Comparison: Previous exam(s).

CLINICAL DATA: 49-year-old female presenting as a recall from
screening for possible left breast mass.

EXAM:
DIGITAL DIAGNOSTIC LEFT MAMMOGRAM WITH TOMOSYNTHESIS
ULTRASOUND LEFT BREAST
TECHNIQUE: Left digital diagnostic mammography and breast tomosynthesis was
performed. Targeted ultrasound examination of the Left breast was
performed.

[L MLO synth-2D]
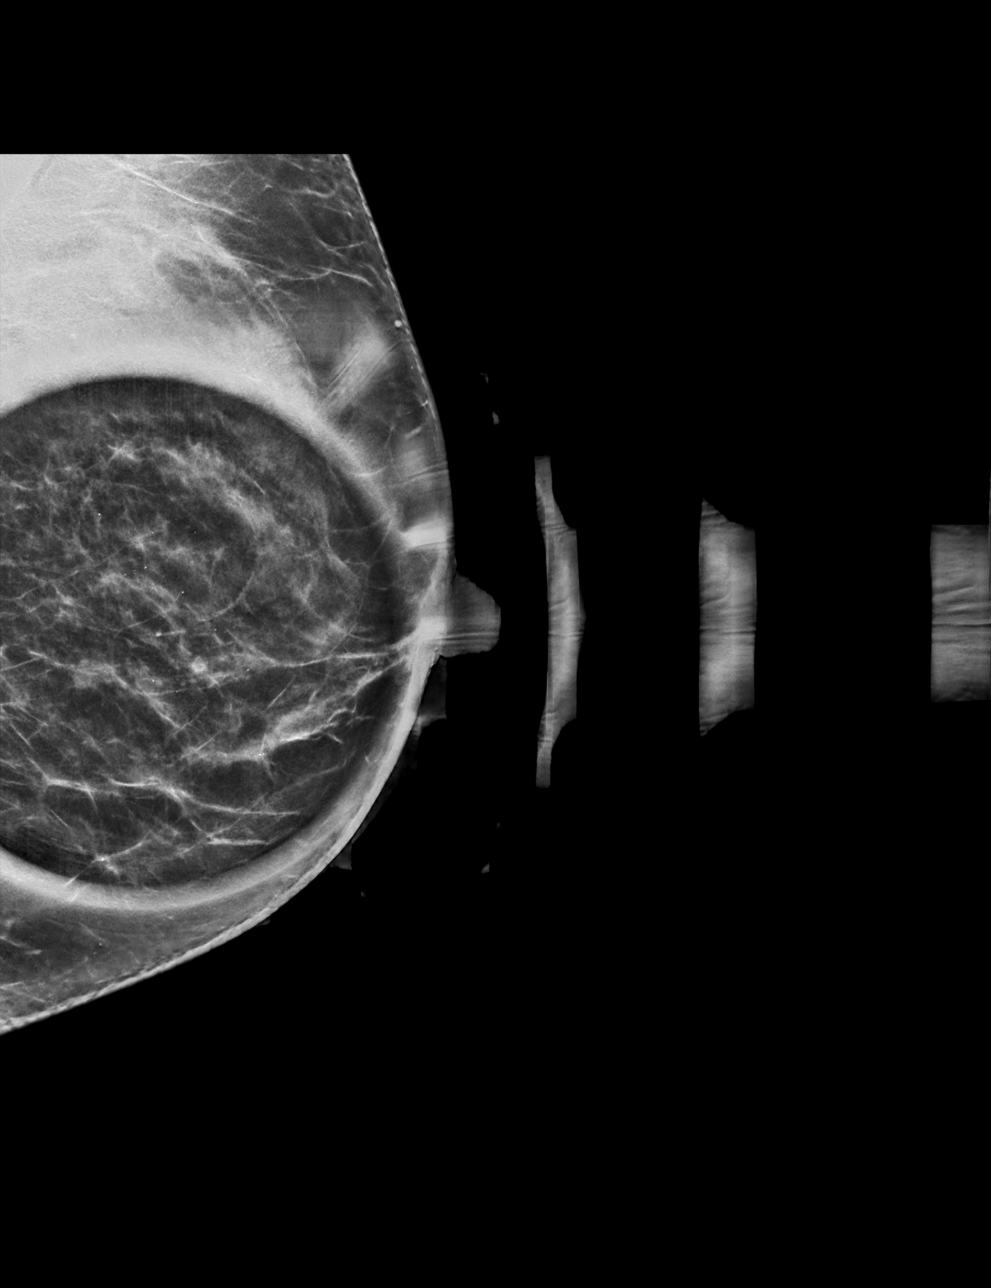

[L CC synth-2D]
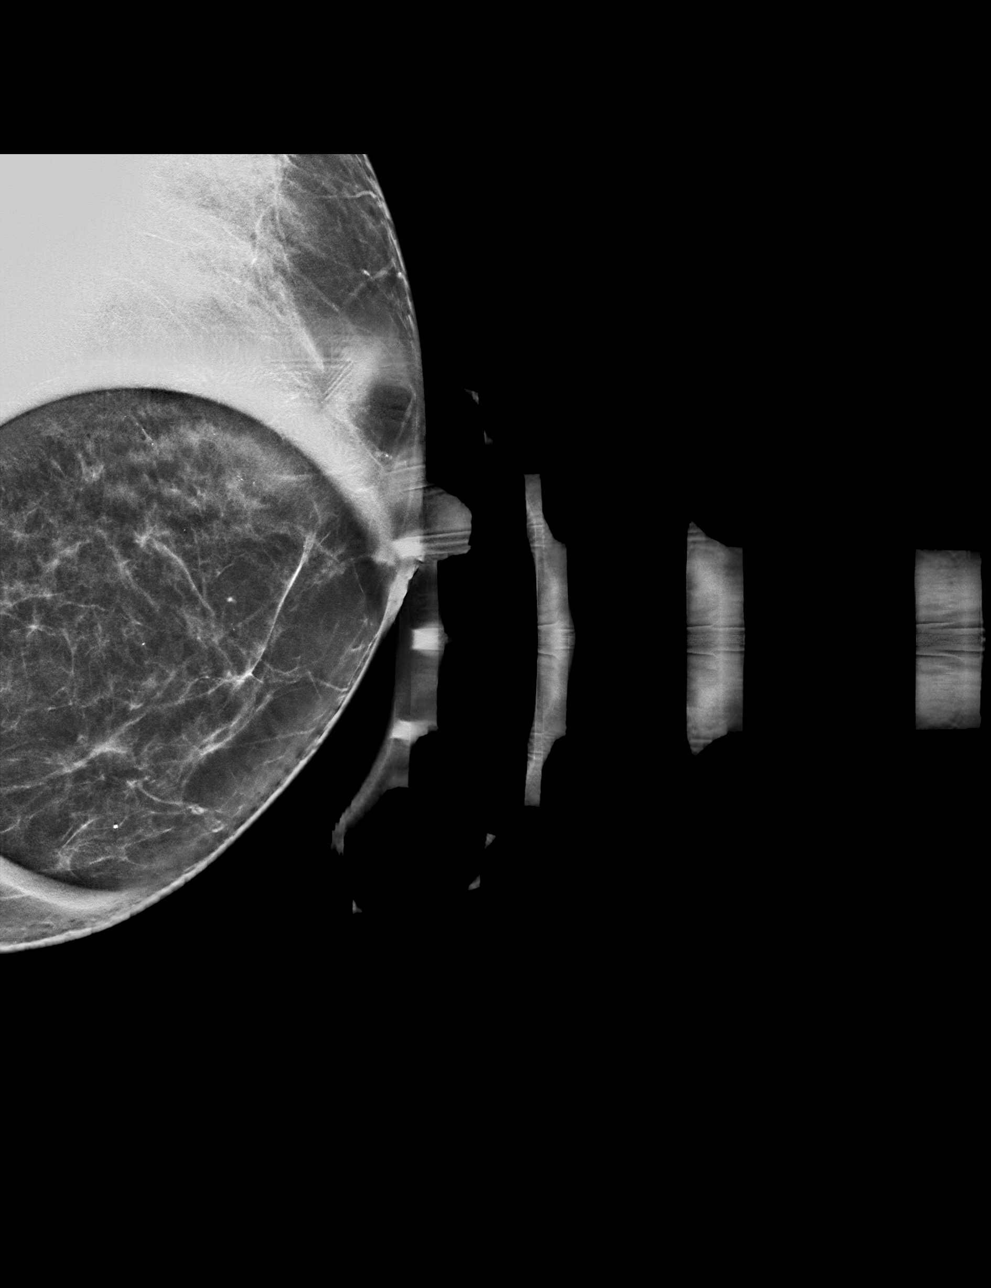

[L MLO tomo · tomo slice 33/65.0]
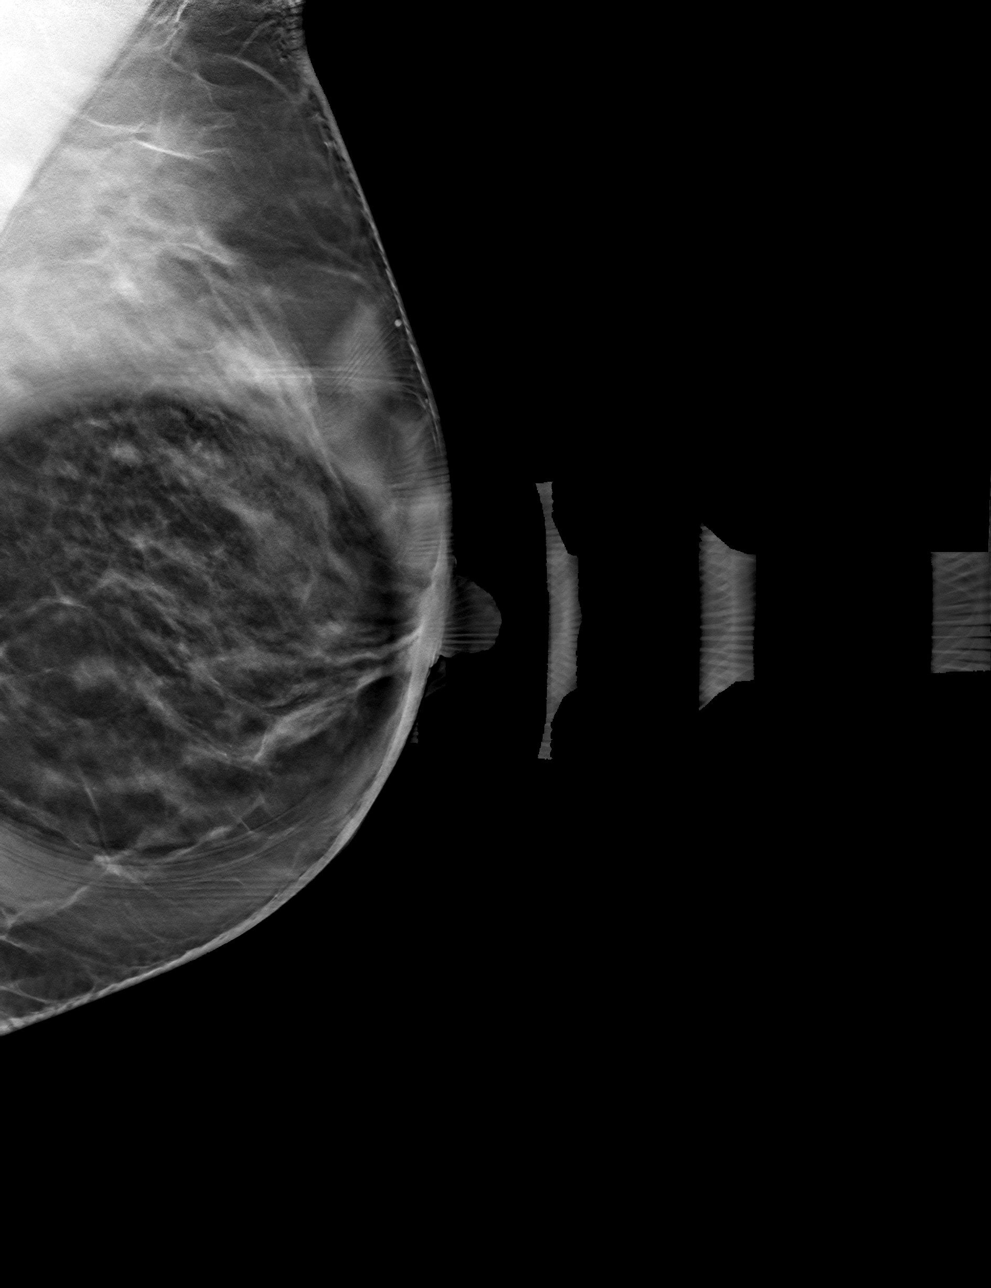

[L CC tomo · tomo slice 30/59.0]
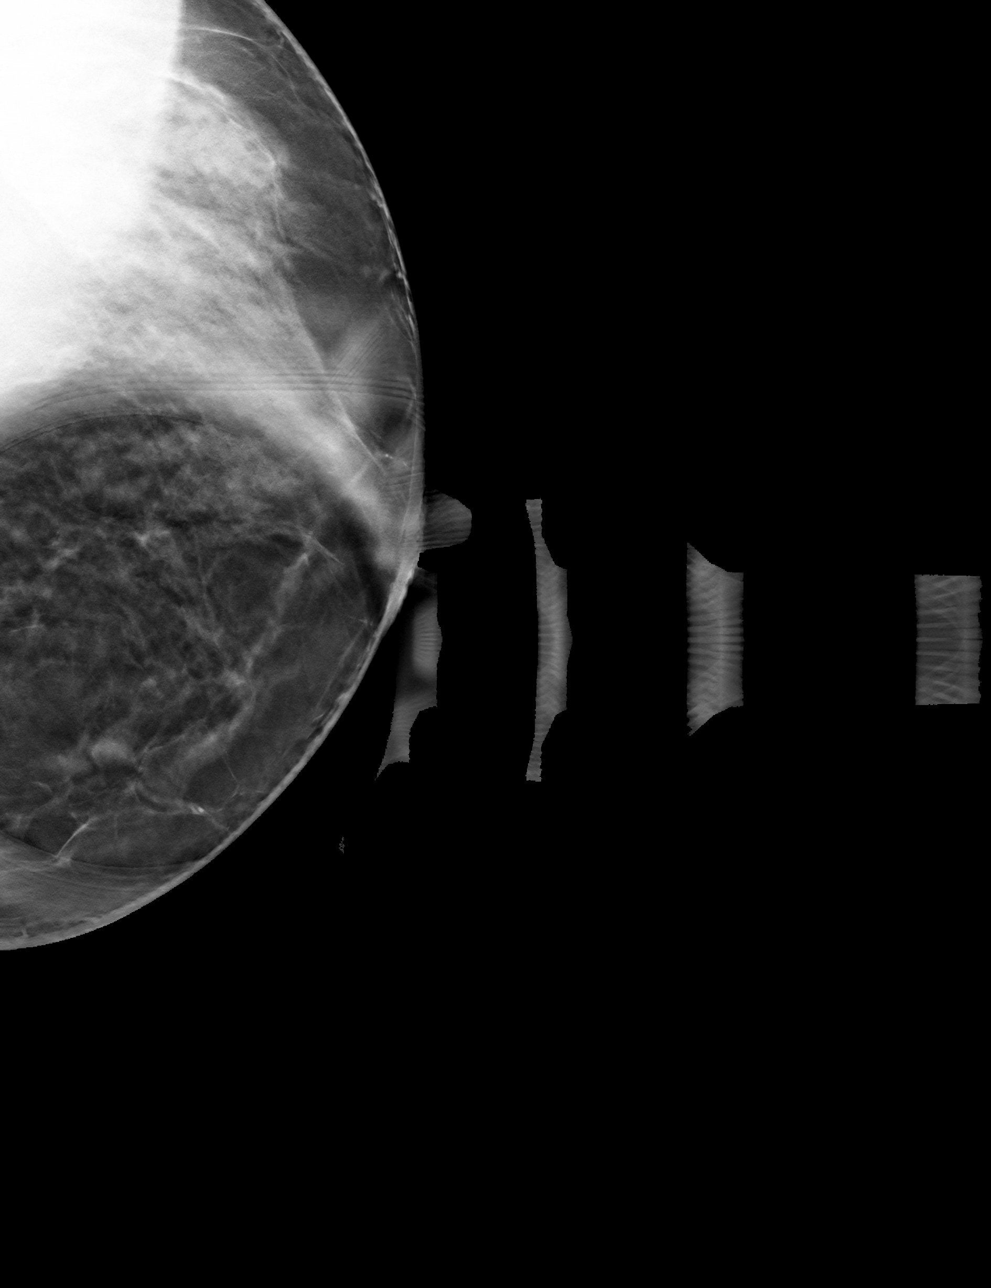

[4 of 12 positions shown; findings below may reference images not displayed]

ACR Breast Density Category c: The breast tissue is heterogeneously
dense, which may obscure small masses.
FINDINGS: Mammogram:

Spot compression tomosynthesis views of the left breast performed
demonstrating persistence of an oval circumscribed mass measuring
0.6 cm in the medial left breast.

Ultrasound:

Targeted ultrasound is performed in the left breast at 10 o'clock 4
cm from the nipple demonstrating an oval circumscribed anechoic mass
measuring 0.6 x 0.4 x 0.5 cm, consistent with a benign simple cyst.
This mass corresponds to the mammographic finding.
IMPRESSION: Benign simple cyst in the left breast at 10 o'clock.

RECOMMENDATION:
Screening mammogram in one year.(Code:G6-2-UQY)

I have discussed the findings and recommendations with the patient.
If applicable, a reminder letter will be sent to the patient
regarding the next appointment.

BI-RADS CATEGORY  2: Benign.

## 2022-08-19 ENCOUNTER — Encounter: Payer: Self-pay | Admitting: *Deleted

## 2022-11-01 ENCOUNTER — Ambulatory Visit
Admission: RE | Admit: 2022-11-01 | Discharge: 2022-11-01 | Disposition: A | Discharge status: Home / Self Care | Payer: 59 | Source: Ambulatory Visit | Attending: Family Medicine | Admitting: Family Medicine

## 2022-11-01 DIAGNOSIS — M8580 Other specified disorders of bone density and structure, unspecified site: Secondary | ICD-10-CM

## 2023-01-21 ENCOUNTER — Ambulatory Visit: Payer: 59 | Admitting: Sports Medicine

## 2023-01-22 ENCOUNTER — Ambulatory Visit: Payer: 59 | Admitting: Family Medicine

## 2023-01-29 ENCOUNTER — Ambulatory Visit (INDEPENDENT_AMBULATORY_CARE_PROVIDER_SITE_OTHER): Payer: 59 | Admitting: Family Medicine

## 2023-01-29 VITALS — BP 136/88 | Ht 62.0 in | Wt 160.0 lb

## 2023-01-29 DIAGNOSIS — M810 Age-related osteoporosis without current pathological fracture: Secondary | ICD-10-CM

## 2023-01-29 DIAGNOSIS — M5010 Cervical disc disorder with radiculopathy, unspecified cervical region: Secondary | ICD-10-CM

## 2023-01-29 NOTE — Patient Instructions (Addendum)
Get labwork after you leave today. Consider reclast (zoledronic acid) IV, prolia as treatment options assuming those are ok. We will look into Evenity but it's unlikely this will be approved without a few risk factors. For your neck you have a C7 radiculopathy (irritated nerve in the neck) - take aleve 1-2 tabs twice a day with food for 7 days then as needed. Do home exercises as directed. If not improving over the next couple weeks let us know - would consider physical therapy as next step.

## 2023-01-30 LAB — COMPREHENSIVE METABOLIC PANEL
ALT: 43 IU/L — ABNORMAL HIGH (ref 0–32)
AST: 33 IU/L (ref 0–40)
Albumin/Globulin Ratio: 2 (ref 1.2–2.2)
Albumin: 4.9 g/dL (ref 3.8–4.9)
Alkaline Phosphatase: 89 IU/L (ref 44–121)
BUN/Creatinine Ratio: 18 (ref 9–23)
BUN: 14 mg/dL (ref 6–24)
Bilirubin Total: 1.4 mg/dL — ABNORMAL HIGH (ref 0.0–1.2)
CO2: 23 mmol/L (ref 20–29)
Calcium: 10.2 mg/dL (ref 8.7–10.2)
Chloride: 101 mmol/L (ref 96–106)
Creatinine, Ser: 0.76 mg/dL (ref 0.57–1.00)
Globulin, Total: 2.4 g/dL (ref 1.5–4.5)
Glucose: 95 mg/dL (ref 70–99)
Potassium: 4.9 mmol/L (ref 3.5–5.2)
Sodium: 141 mmol/L (ref 134–144)
Total Protein: 7.3 g/dL (ref 6.0–8.5)
eGFR: 94 mL/min/{1.73_m2} (ref >59)

## 2023-01-30 LAB — CBC WITH DIFFERENTIAL/PLATELET
Basophils Absolute: 0 10*3/uL (ref 0.0–0.2)
Basos: 0 %
EOS (ABSOLUTE): 0.1 10*3/uL (ref 0.0–0.4)
Eos: 1 %
Hematocrit: 43.5 % (ref 34.0–46.6)
Hemoglobin: 15.3 g/dL (ref 11.1–15.9)
Immature Grans (Abs): 0 10*3/uL (ref 0.0–0.1)
Immature Granulocytes: 0 %
Lymphocytes Absolute: 2 10*3/uL (ref 0.7–3.1)
Lymphs: 25 %
MCH: 32.5 pg (ref 26.6–33.0)
MCHC: 35.2 g/dL (ref 31.5–35.7)
MCV: 92 fL (ref 79–97)
Monocytes Absolute: 0.8 10*3/uL (ref 0.1–0.9)
Monocytes: 11 %
Neutrophils Absolute: 4.8 10*3/uL (ref 1.4–7.0)
Neutrophils: 63 %
Platelets: 320 10*3/uL (ref 150–450)
RBC: 4.71 x10E6/uL (ref 3.77–5.28)
RDW: 12.6 % (ref 11.7–15.4)
WBC: 7.7 10*3/uL (ref 3.4–10.8)

## 2023-01-30 LAB — PARATHYROID HORMONE, INTACT (NO CA): PTH: 29 pg/mL (ref 15–65)

## 2023-01-30 LAB — TSH: TSH: 2.72 u[IU]/mL (ref 0.450–4.500)

## 2023-01-30 NOTE — Progress Notes (Signed)
PCP: Kathyrn Lass, MD  Subjective:   HPI: Patient is a 52 y.o. female here for osteoporosis, left hand tingling.  Osteoporosis: Prior treatment: fosamax x 3 doses - severe reflux from this History of Hip, Spine, or Wrist Fracture: no Heart disease or stroke: no Cancer: no Kidney Disease: no Gastric/Peptic Ulcer: no Gastric bypass surgery: no Severe GERD: yes, controlled by medication (omeprazole) History of seizures: no Age at Menopause: ~51 Calcium intake: '400mg'$  daily? Vitamin D intake: included with citracal Hormone replacement therapy: no Smoking history: never Alcohol: 5/week Exercise: walking Major dental work in past year: 1 crown and root canal Parents with hip/spine fracture: no  She's also reported some discomfort in posterior neck and numbness/tingling in left upper extremity. Seems to be improved for the most part with tingling only in fingertips of 2nd-4th digits. No bowel/bladder dysfunction.  Past Medical History:  Diagnosis Date   Acne    Adverse effect of anesthetic    succinylcholine causes 'severe muscle pain for days'   Carpal tunnel syndrome of left wrist    Complication of anesthesia    lidocaine causes hives   GAD (generalized anxiety disorder)    GERD (gastroesophageal reflux disease)    H/O lymphadenitis    right lateral neck   Hearing loss    due to otosclerosis   History of thyroid nodule    IUD contraception     Current Outpatient Medications on File Prior to Visit  Medication Sig Dispense Refill   ALPRAZolam (XANAX) 0.5 MG tablet Take 1 tablet (0.5 mg total) by mouth daily as needed for anxiety. 30 tablet 0   buPROPion (WELLBUTRIN XL) 300 MG 24 hr tablet TAKE 1 TABLET BY MOUTH  DAILY 90 tablet 0   levonorgestrel (MIRENA, 52 MG,) 20 MCG/24HR IUD by Intrauterine route.     Naproxen Sod-diphenhydrAMINE (ALEVE PM PO) Take by mouth. Took 2 last pm     omeprazole (PRILOSEC) 40 MG capsule TAKE 1 CAPSULE BY MOUTH  DAILY 90 capsule 3    oxyCODONE (OXY IR/ROXICODONE) 5 MG immediate release tablet Take 1 tablet (5 mg total) by mouth every 6 (six) hours as needed for moderate pain, severe pain or breakthrough pain. 12 tablet 0   No current facility-administered medications on file prior to visit.    Past Surgical History:  Procedure Laterality Date   BREAST CYST ASPIRATION Left 11/09/2015   CESAREAN SECTION     CHOLECYSTECTOMY N/A 12/31/2018   Procedure: LAPAROSCOPIC CHOLECYSTECTOMY;  Surgeon: Rolm Bookbinder, MD;  Location: [place];  Service: General;  Laterality: N/A;   MANDIBLE SURGERY      Allergies  Allergen Reactions   Succinylcholine Other (See Comments)    Extreme muscle pain   Cephalexin Hives   Chlorhexidine Gluconate Hives   Dicloxacillin     Unsure of reaction    Lidocaine     Lidocaine topical   Tetracyclines & Related     Unsure of reaction     BP 136/88   Ht '5\' 2"'$  (1.575 m)   Wt 160 lb (72.6 kg)   BMI 29.26 kg/m       No data to display              No data to display              Objective:  Physical Exam:  Gen: NAD, comfortable in exam room  11/19/22 - 25-OH Vit D 34.5  Dexa 11/01/22 T-scores: Spine -2.4, Femoral neck -2.8, Total  femur -1.6  Neck: No gross deformity, swelling, bruising. No paraspinal TTP.  No midline/bony TTP. FROM. BUE strength 5/5.   Sensation intact to light touch.   NV intact distal BUEs.  Assessment & Plan:  1. Osteoporosis - patient has taken fosamax for a few doses but had severe reflux.  Her femoral neck T-score is < -2.5.  Will check labwork (CBC, CMP, TSH, PTH).  We discussed treatment options - she is interested in [place] but suspect she does not have enough risk factors to get this approved.  Consider zoledronic acid yearly, prolia pending lab results.  Discussed risks of atypical femur fractures, osteonecrosis of the jaw, potential side effects of treatment.  Repeat bone density 11/01/24.  2. Left cervical  radiculopathy - in C7 distribution though seems to be improving.  Shown home exercises to do daily.  Aleve for 7 days then as needed.  Total visit time 35 minutes including documentation.

## 2023-01-31 ENCOUNTER — Telehealth: Payer: Self-pay

## 2023-01-31 NOTE — Telephone Encounter (Signed)
[place], [place], LAT  [place], [place], [place] Cc: April Manson C, CMA Please look into evenity benefits for this pt.  Thanks!

## 2023-02-01 NOTE — Telephone Encounter (Signed)
Prolia VOB initiated via [email or web address]  Last OV: 01/29/23 - [place] Next OV:  Last Prolia inj: new start

## 2023-02-01 NOTE — Telephone Encounter (Signed)
HPI: Patient is a 52 y.o. female here for osteoporosis, left hand tingling.   Osteoporosis: Prior treatment: fosamax x 3 doses - severe reflux from this History of Hip, Spine, or Wrist Fracture: no Heart disease or stroke: no Cancer: no Kidney Disease: no Gastric/Peptic Ulcer: no Gastric bypass surgery: no Severe GERD: yes, controlled by medication (omeprazole) History of seizures: no Age at Menopause: ~51 Calcium intake: '400mg'$  daily? Vitamin D intake: included with citracal Hormone replacement therapy: no Smoking history: never Alcohol: 5/week Exercise: walking Major dental work in past year: 1 crown and root canal Parents with hip/spine fracture: no   Assessment & Plan:  1. Osteoporosis - patient has taken fosamax for a few doses but had severe reflux.  Her femoral neck T-score is < -2.5.  Will check labwork (CBC, CMP, TSH, PTH).  We discussed treatment options - she is interested in [place] but suspect she does not have enough risk factors to get this approved.  Consider zoledronic acid yearly, prolia pending lab results.  Discussed risks of atypical femur fractures, osteonecrosis of the jaw, potential side effects of treatment.  Repeat bone density 11/01/24.

## 2023-02-07 NOTE — Telephone Encounter (Signed)
ELIGIBLE FOR COPAY ENROLLMENT

## 2023-02-11 ENCOUNTER — Encounter: Payer: Self-pay | Admitting: Family Medicine

## 2023-02-12 NOTE — Telephone Encounter (Addendum)
Prior auth required for PROLIA (also running benefits and PA for [employer])  PA PROCESS DETAILS: Please complete the prior authorization form located at [email or web address] or call [phone].

## 2023-02-16 ENCOUNTER — Telehealth: Payer: Self-pay

## 2023-02-16 NOTE — Telephone Encounter (Signed)
Jolinda Croak, LAT  Jasper Loser, CMA      Previous Messages    ----- Message ----- From: Cyd Silence, CMA Sent: 02/12/2023   4:05 PM EDT To: Jolinda Croak, LAT  Yea I think Dr Barbaraann Barthel stated that as well.  Just let us know once the final benefits outcome are present and have been put in her chart and I will give her a call  thanks ----- Message ----- From: Jolinda Croak, LAT Sent: 02/12/2023   2:38 PM EDT To: Cyd Silence, CMA   ----- Message ----- From: Jasper Loser, CMA Sent: 02/12/2023   2:36 PM EDT To: Jolinda Croak, LAT  I'm checking Evenity, but also Prolia, for this patient. It doesn't looks like she's had a fracture so she may not get approved for Evenity. ----- Message ----- From: Jolinda Croak, LAT Sent: 01/31/2023   9:20 AM EDT To: Cyd Silence, CMA; Jasper Loser, CMA  Please look into evenity benefits for this pt.  Thanks!

## 2023-02-17 NOTE — Telephone Encounter (Signed)
Prior auth required for EVENITY ° °PA PROCESS DETAILS: Please complete the prior authorization form located at [email or web address] or call [phone]. °

## 2023-02-22 NOTE — Telephone Encounter (Signed)
APPROVED  PA# [id number] Valid: 02/22/23-02/22/24

## 2023-02-22 NOTE — Telephone Encounter (Addendum)
NEEDS COPAY ENROLLMENT   APPROVED PA# [id number] Valid: 02/22/23-02/22/24

## 2023-02-22 NOTE — Telephone Encounter (Signed)
NEEDS COPAY ENROLLMENT  Out-of-pocket cost due at time of visit: $327  Primary: [employer] Prolia co-insurance: 20% (approximately $302) Admin fee co-insurance: 20% (approximately $25)  Deductible:   Secondary:  Prolia co-insurance:  Admin fee co-insurance:   Deductible:   Prior Auth:  PA# Valid:   ** This summary of benefits is an estimation of the patient's out-of-pocket cost. Exact cost may vary based on individual plan coverage.

## 2023-02-26 ENCOUNTER — Encounter: Payer: Self-pay | Admitting: *Deleted

## 2023-02-26 NOTE — Telephone Encounter (Signed)
Attempted to create copay account for pt, received message that pt already has an [place] account.   Please reach out to confirm that pt is already enrolled in and active with the copay program.   Thanks!

## 2023-03-03 NOTE — Telephone Encounter (Signed)
It appears this patient already has an [employer] Co-[employer] Program account.

## 2023-03-19 ENCOUNTER — Encounter: Payer: Self-pay | Admitting: Family Medicine

## 2023-03-19 ENCOUNTER — Ambulatory Visit (INDEPENDENT_AMBULATORY_CARE_PROVIDER_SITE_OTHER): Payer: 59 | Admitting: Family Medicine

## 2023-03-19 DIAGNOSIS — M810 Age-related osteoporosis without current pathological fracture: Secondary | ICD-10-CM | POA: Diagnosis not present

## 2023-03-19 MED ORDER — ROMOSOZUMAB-AQQG 105 MG/1.17ML ~~LOC~~ SOSY
210.0000 mg | PREFILLED_SYRINGE | Freq: Once | SUBCUTANEOUS | Status: AC
  Administered 2023-03-19: 210 mg via SUBCUTANEOUS

## 2023-03-19 NOTE — Progress Notes (Signed)
Patient is here for her #1 evenity injection. Patient received bilateral arm [place] evenity injections today. She tolerated injections well. Pt waited 15 mins post injection for reaction. She will return in 1 month for her next evenity injection.

## 2023-04-04 NOTE — Telephone Encounter (Addendum)
Evenity Injection Schedule Inj #1 - 03/19/23 Inj #2 - 04/23/23 Inj #3 - 05/28/23 Inj #4 - scheduled 06/30/23 Inj #5  Inj #6  Inj #7  Inj #8  Inj #9  Inj #10  Inj #11  Inj #12   Prior Auth: APPROVED PA# [id number] Valid: 02/22/23-02/22/24

## 2023-04-04 NOTE — Telephone Encounter (Signed)
Pt ready for scheduling on or after 02/22/23  Out-of-pocket cost due at time of visit: $450.53 ($25 if using [employer] Co-Pay Assistance)  Primary:  Evenity co-insurance: 20% ($450.53) Admin fee co-insurance: 0%  Deductible: $3200 of $3200 met  Prior Auth: APPROVED PA# [id number] Valid: 02/22/23-02/22/24  Secondary: N/A Evenity co-insurance:  Admin fee co-insurance:  Deductible:  Prior Auth:  PA# Valid:    ** This summary of benefits is an estimation of the patient's out-of-pocket cost. Exact cost may vary based on individual plan coverage.

## 2023-04-04 NOTE — Telephone Encounter (Signed)
Proceed with Evenity.

## 2023-04-23 ENCOUNTER — Ambulatory Visit: Payer: 59 | Admitting: Sports Medicine

## 2023-04-23 DIAGNOSIS — M810 Age-related osteoporosis without current pathological fracture: Secondary | ICD-10-CM

## 2023-04-23 MED ORDER — ROMOSOZUMAB-AQQG 105 MG/1.17ML ~~LOC~~ SOSY
210.0000 mg | PREFILLED_SYRINGE | Freq: Once | SUBCUTANEOUS | Status: AC
  Administered 2023-04-23: 210 mg via SUBCUTANEOUS

## 2023-04-23 NOTE — Progress Notes (Signed)
Patient is here for her #2 evenity injection. Patient received bilateral arm [place] evenity injections today. She tolerated injections well. She will return in 1 month for her next evenity injection.   

## 2023-05-23 ENCOUNTER — Ambulatory Visit: Payer: 59 | Admitting: Sports Medicine

## 2023-05-28 ENCOUNTER — Ambulatory Visit: Payer: 59 | Admitting: Sports Medicine

## 2023-05-28 VITALS — Ht 62.0 in

## 2023-05-28 DIAGNOSIS — M810 Age-related osteoporosis without current pathological fracture: Secondary | ICD-10-CM

## 2023-05-28 MED ORDER — ROMOSOZUMAB-AQQG 105 MG/1.17ML ~~LOC~~ SOSY
210.0000 mg | PREFILLED_SYRINGE | Freq: Once | SUBCUTANEOUS | Status: AC
  Administered 2023-05-28: 210 mg via SUBCUTANEOUS

## 2023-05-28 NOTE — Progress Notes (Signed)
Patient is here for evenity injection #3. Patient received bilateral arm [place] evenity injections today. She tolerated injections well. She will return in 1 month for her next evenity injection.   

## 2023-06-18 NOTE — Telephone Encounter (Signed)
EOB x 3 submitted by fax.

## 2023-06-19 NOTE — Telephone Encounter (Signed)
Funds uploaded to [employer], sent to [employer] for [employer].

## 2023-06-23 ENCOUNTER — Ambulatory Visit: Payer: 59 | Admitting: Family Medicine

## 2023-06-30 ENCOUNTER — Ambulatory Visit: Payer: 59 | Admitting: Family Medicine

## 2023-07-02 ENCOUNTER — Ambulatory Visit: Payer: 59 | Admitting: Family Medicine

## 2023-07-02 DIAGNOSIS — M810 Age-related osteoporosis without current pathological fracture: Secondary | ICD-10-CM | POA: Diagnosis not present

## 2023-07-02 MED ORDER — ROMOSOZUMAB-AQQG 105 MG/1.17ML ~~LOC~~ SOSY
210.0000 mg | PREFILLED_SYRINGE | Freq: Once | SUBCUTANEOUS | Status: AC
  Administered 2023-07-02: 210 mg via SUBCUTANEOUS

## 2023-07-02 NOTE — Progress Notes (Signed)
Patient is here for evenity injection #4. Patient received bilateral arm [place] evenity injections today. She tolerated injections well. She will return in 1 month for her next evenity injection

## 2023-07-09 NOTE — Telephone Encounter (Addendum)
Evenity Injection Schedule Inj #1 - 03/19/23 Inj #2 - 04/23/23 Inj #3 - 05/28/23 Inj #4 - 07/02/23 Inj #5 - 08/04/23 Inj #6 - schedule 09/05/23 Inj #7  Inj #8  Inj #9  Inj #10  Inj #11  Inj #12    Prior Auth: APPROVED PA# [id number] Valid: 02/22/23-02/22/24

## 2023-08-04 ENCOUNTER — Ambulatory Visit (INDEPENDENT_AMBULATORY_CARE_PROVIDER_SITE_OTHER): Payer: 59 | Admitting: Family Medicine

## 2023-08-04 ENCOUNTER — Other Ambulatory Visit: Payer: Self-pay | Admitting: Family Medicine

## 2023-08-04 VITALS — Ht 62.0 in

## 2023-08-04 DIAGNOSIS — Z1231 Encounter for screening mammogram for malignant neoplasm of breast: Secondary | ICD-10-CM

## 2023-08-04 DIAGNOSIS — M810 Age-related osteoporosis without current pathological fracture: Secondary | ICD-10-CM | POA: Diagnosis not present

## 2023-08-04 MED ORDER — ROMOSOZUMAB-AQQG 105 MG/1.17ML ~~LOC~~ SOSY
210.0000 mg | PREFILLED_SYRINGE | Freq: Once | SUBCUTANEOUS | Status: AC
  Administered 2023-08-04: 210 mg via SUBCUTANEOUS

## 2023-08-04 NOTE — Progress Notes (Signed)
Patient is here for evenity injection #5. Patient received bilateral arm [place] evenity injections today. She tolerated injections well. She will return in 1 month for her next evenity injection.

## 2023-08-14 NOTE — Telephone Encounter (Signed)
EOB submitted for DOS 07/02/23 and 08/04/23 Evenity injections.

## 2023-08-20 ENCOUNTER — Ambulatory Visit: Payer: 59

## 2023-08-25 NOTE — Telephone Encounter (Signed)
EOB uploaded for DOS 07/02/23 and 08/04/23.

## 2023-09-02 ENCOUNTER — Ambulatory Visit
Admission: RE | Admit: 2023-09-02 | Discharge: 2023-09-02 | Disposition: A | Discharge status: Home / Self Care | Payer: 59 | Source: Ambulatory Visit | Attending: Family Medicine | Admitting: Family Medicine

## 2023-09-02 DIAGNOSIS — Z1231 Encounter for screening mammogram for malignant neoplasm of breast: Secondary | ICD-10-CM

## 2023-09-05 ENCOUNTER — Ambulatory Visit: Payer: 59 | Admitting: Family Medicine

## 2023-09-05 VITALS — Ht 62.0 in

## 2023-09-05 DIAGNOSIS — M810 Age-related osteoporosis without current pathological fracture: Secondary | ICD-10-CM | POA: Diagnosis not present

## 2023-09-05 MED ORDER — ROMOSOZUMAB-AQQG 105 MG/1.17ML ~~LOC~~ SOSY
210.0000 mg | PREFILLED_SYRINGE | Freq: Once | SUBCUTANEOUS | Status: AC
  Administered 2023-09-05: 210 mg via SUBCUTANEOUS

## 2023-09-05 NOTE — Progress Notes (Signed)
Patient is here for evenity injection #6. Patient received bilateral arm [place] evenity injections today. She tolerated injections well. She will return in 1 month for her next evenity injection.

## 2023-09-07 NOTE — Telephone Encounter (Addendum)
Evenity Injection Schedule Inj #1 - 03/19/23 Inj #2 - 04/23/23 Inj #3 - 05/28/23 Inj #4 - 07/02/23 Inj #5 - 08/04/23 Inj #6 - 09/05/23 Inj #7 - 10/13/23 Inj #8 -  11/13/23  Re-run benefits for 2025  Inj #9 - scheduled 12/16/23 Inj #10 - scheduled 01/20/24 Inj #11  Inj #12    Prior Auth: APPROVED PA# [id number] Valid: 02/22/23-02/22/24

## 2023-09-20 MED ORDER — ROMOSOZUMAB-AQQG 105 MG/1.17ML ~~LOC~~ SOSY: 210.0000 mg | PREFILLED_SYRINGE | Freq: Once | SUBCUTANEOUS | Status: AC

## 2023-09-20 NOTE — Addendum Note (Signed)
Addended by: Dierdre Searles on: 09/20/2023 05:37 PM   Modules accepted: Orders

## 2023-10-08 ENCOUNTER — Ambulatory Visit: Payer: 59 | Admitting: Family Medicine

## 2023-10-13 ENCOUNTER — Ambulatory Visit (INDEPENDENT_AMBULATORY_CARE_PROVIDER_SITE_OTHER): Payer: 59 | Admitting: Family Medicine

## 2023-10-13 ENCOUNTER — Encounter: Payer: Self-pay | Admitting: Family Medicine

## 2023-10-13 VITALS — Ht 62.0 in

## 2023-10-13 VITALS — BP 154/110 | Ht 62.0 in | Wt 155.0 lb

## 2023-10-13 DIAGNOSIS — M810 Age-related osteoporosis without current pathological fracture: Secondary | ICD-10-CM

## 2023-10-13 DIAGNOSIS — M5010 Cervical disc disorder with radiculopathy, unspecified cervical region: Secondary | ICD-10-CM | POA: Diagnosis not present

## 2023-10-13 MED ORDER — METHOCARBAMOL 500 MG PO TABS: 500.0000 mg | ORAL_TABLET | Freq: Three times a day (TID) | ORAL | 1 refills | Status: DC | PRN

## 2023-10-13 MED ORDER — DICLOFENAC SODIUM 75 MG PO TBEC: 75.0000 mg | DELAYED_RELEASE_TABLET | Freq: Two times a day (BID) | ORAL | 1 refills | Status: AC

## 2023-10-13 MED ORDER — ROMOSOZUMAB-AQQG 105 MG/1.17ML ~~LOC~~ SOSY
210.0000 mg | PREFILLED_SYRINGE | Freq: Once | SUBCUTANEOUS | Status: AC
  Administered 2023-10-13: 210 mg via SUBCUTANEOUS

## 2023-10-13 NOTE — Patient Instructions (Signed)
You have cervical radiculopathy (a pinched nerve in the neck). Diclofenac 75mg  twice a day with food for pain and inflammation. Robaxin up to three times a day as needed for muscle spasms (do not drive with this if it makes you sleepy). Simple range of motion exercises within limits of pain to prevent further stiffness. Consider physical therapy for stretching, exercises, traction, and modalities after a week. Heat 15 minutes at a time 3-4 times a day to help with spasms. Watch head position when on computers, texting, when sleeping in bed - should in line with back to prevent further nerve traction and irritation. Call or message me Thursday or Friday morning if you're not improving and we would send in a steroid dose pack. Call or message Monday if you are improving and would put in an order for physical therapy.

## 2023-10-13 NOTE — Progress Notes (Signed)
PCP: Sigmund Hazel, MD  Subjective:   HPI: Patient is a 52 y.o. female here for left shoulder pain.  Patient reports for 1 month she's had worsening posterior left shoulder pain. No injury or trauma. Started in left shoulder blade area with radiation down left arm. Associated numbness/tingling down arm to hand. Pain severe and has difficulty getting comfortable at times. Tried topical voltaren gel, heat/ice, aleve. No bowel/bladder dysfunction.  Past Medical History:  Diagnosis Date   Acne    Adverse effect of anesthetic    succinylcholine causes 'severe muscle pain for days'   Carpal tunnel syndrome of left wrist    Complication of anesthesia    lidocaine causes hives   GAD (generalized anxiety disorder)    GERD (gastroesophageal reflux disease)    H/O lymphadenitis    right lateral neck   Hearing loss    due to otosclerosis   History of thyroid nodule    IUD contraception    OP (osteoporosis) 03/19/2023    Current Outpatient Medications on File Prior to Visit  Medication Sig Dispense Refill   ALPRAZolam (XANAX) 0.5 MG tablet Take 1 tablet (0.5 mg total) by mouth daily as needed for anxiety. 30 tablet 0   buPROPion (WELLBUTRIN XL) 300 MG 24 hr tablet TAKE 1 TABLET BY MOUTH  DAILY 90 tablet 0   levonorgestrel (MIRENA, 52 MG,) 20 MCG/24HR IUD by Intrauterine route.     Naproxen Sod-diphenhydrAMINE (ALEVE PM PO) Take by mouth. Took 2 last pm     omeprazole (PRILOSEC) 40 MG capsule TAKE 1 CAPSULE BY MOUTH  DAILY 90 capsule 3   oxyCODONE (OXY IR/ROXICODONE) 5 MG immediate release tablet Take 1 tablet (5 mg total) by mouth every 6 (six) hours as needed for moderate pain, severe pain or breakthrough pain. 12 tablet 0   Current Facility-Administered Medications on File Prior to Visit  Medication Dose Route Frequency Provider Last Rate Last Admin   Romosozumab-aqqg (EVENITY) 105 MG/1. injection 210 mg  210 mg Subcutaneous Once Rhyder Koegel, Azucena Fallen, MD        Past Surgical  History:  Procedure Laterality Date   BREAST CYST ASPIRATION Left 11/09/2015   CESAREAN SECTION     CHOLECYSTECTOMY N/A 12/31/2018   Procedure: LAPAROSCOPIC CHOLECYSTECTOMY;  Surgeon: [name], MD;  Location: [facility];  Service: General;  Laterality: N/A;   MANDIBLE SURGERY      Allergies  Allergen Reactions   Succinylcholine Other (See Comments)    Extreme muscle pain   Cephalexin Hives   Chlorhexidine Gluconate Hives   Dicloxacillin     Unsure of reaction    Lidocaine     Lidocaine topical   Tetracyclines & Related     Unsure of reaction     BP (!) 154/110   Ht 5\' 2"  (1.575 m)   Wt 155 lb (70.3 kg)   BMI 28.35 kg/m       No data to display              No data to display              Objective:  Physical Exam:  Gen: NAD, comfortable in exam room  Neck: No gross deformity, swelling, bruising. TTP left lower cervical paraspinal muscles.  No midline/bony TTP. Extension to only 10 degrees.  FROM otherwise. BUE strength 5/5.   Sensation intact to light touch.   2+ equal reflexes in triceps, biceps, brachioradialis tendons. NV intact distal BUEs.  Left shoulder: No  swelling, ecchymoses.  No gross deformity. No TTP. FROM. Negative Hawkins. Negative Yergasons. Strength 5/5 with empty can and resisted internal/external rotation. NV intact distally.   Assessment & Plan:  1. Cervical radiculopathy - distribution, symptoms consistent with radiculopathy.  Has history of osteoporosis - will try to avoid oral prednisone pack and instead take diclofenac 75mg  twice a day with food.  Robaxin as needed.  ROM exercises.  Heat.  Ergonomic issues discussed.

## 2023-10-13 NOTE — Progress Notes (Signed)
Patient is here for evenity injection #7. Patient received bilateral arm [place] evenity injections today. She tolerated injections well. She will return in 1 month for her next evenity injection.

## 2023-10-14 ENCOUNTER — Ambulatory Visit: Payer: 59 | Admitting: Family Medicine

## 2023-10-15 ENCOUNTER — Other Ambulatory Visit: Payer: Self-pay

## 2023-10-15 ENCOUNTER — Encounter: Payer: Self-pay | Admitting: Sports Medicine

## 2023-10-15 MED ORDER — PREDNISONE 10 MG PO TABS: ORAL_TABLET | ORAL | 0 refills | Status: AC

## 2023-10-21 ENCOUNTER — Other Ambulatory Visit: Payer: Self-pay

## 2023-10-21 DIAGNOSIS — M5010 Cervical disc disorder with radiculopathy, unspecified cervical region: Secondary | ICD-10-CM

## 2023-10-27 ENCOUNTER — Encounter: Payer: Self-pay | Admitting: Family Medicine

## 2023-10-28 ENCOUNTER — Other Ambulatory Visit: Payer: Self-pay

## 2023-10-28 MED ORDER — GABAPENTIN 300 MG PO CAPS: 300.0000 mg | ORAL_CAPSULE | Freq: Every day | ORAL | 0 refills | Status: DC

## 2023-11-02 ENCOUNTER — Encounter: Payer: Self-pay | Admitting: Family Medicine

## 2023-11-03 ENCOUNTER — Other Ambulatory Visit: Payer: 59

## 2023-11-07 ENCOUNTER — Other Ambulatory Visit: Payer: Self-pay | Admitting: Family Medicine

## 2023-11-07 ENCOUNTER — Encounter: Payer: Self-pay | Admitting: Family Medicine

## 2023-11-07 ENCOUNTER — Other Ambulatory Visit: Payer: Self-pay

## 2023-11-07 DIAGNOSIS — M5010 Cervical disc disorder with radiculopathy, unspecified cervical region: Secondary | ICD-10-CM

## 2023-11-07 MED ORDER — HYDROCODONE-ACETAMINOPHEN 5-325 MG PO TABS: 1.0000 | ORAL_TABLET | Freq: Four times a day (QID) | ORAL | 0 refills | Status: AC | PRN

## 2023-11-07 NOTE — Progress Notes (Signed)
MRI reviewed and discussed with patient.  She has a disc protrusion at C6-7 with narrowing on left - suspect this is the origin of her pain.  Will refer for ESI at this level.  Increase gabapentin to 600mg  tid.  Hydrocodone as needed for severe pain.

## 2023-11-10 ENCOUNTER — Other Ambulatory Visit: Payer: Self-pay | Admitting: Family Medicine

## 2023-11-10 DIAGNOSIS — M5010 Cervical disc disorder with radiculopathy, unspecified cervical region: Secondary | ICD-10-CM

## 2023-11-12 ENCOUNTER — Encounter: Payer: Self-pay | Admitting: Family Medicine

## 2023-11-13 ENCOUNTER — Ambulatory Visit: Payer: 59 | Admitting: Family Medicine

## 2023-11-13 VITALS — Ht 62.0 in

## 2023-11-13 DIAGNOSIS — M810 Age-related osteoporosis without current pathological fracture: Secondary | ICD-10-CM | POA: Diagnosis not present

## 2023-11-13 MED ORDER — METHOCARBAMOL 500 MG PO TABS: 500.0000 mg | ORAL_TABLET | Freq: Three times a day (TID) | ORAL | 1 refills | Status: AC | PRN

## 2023-11-13 MED ORDER — GABAPENTIN 300 MG PO CAPS: 300.0000 mg | ORAL_CAPSULE | Freq: Three times a day (TID) | ORAL | 1 refills | Status: AC

## 2023-11-13 MED ORDER — ROMOSOZUMAB-AQQG 105 MG/1.17ML ~~LOC~~ SOSY
210.0000 mg | PREFILLED_SYRINGE | Freq: Once | SUBCUTANEOUS | Status: AC
  Administered 2023-11-13: 210 mg via SUBCUTANEOUS

## 2023-11-13 NOTE — Progress Notes (Signed)
Patient is here for evenity injection #8. Patient received bilateral arm [place] evenity injections today. She tolerated injections well. She will return in 1 month for her next evenity injection.

## 2023-11-13 NOTE — Discharge Instructions (Signed)

## 2023-11-14 ENCOUNTER — Ambulatory Visit
Admission: RE | Admit: 2023-11-14 | Discharge: 2023-11-14 | Disposition: A | Discharge status: Home / Self Care | Payer: 59 | Source: Ambulatory Visit | Attending: Family Medicine | Admitting: Family Medicine

## 2023-11-14 DIAGNOSIS — M5010 Cervical disc disorder with radiculopathy, unspecified cervical region: Secondary | ICD-10-CM

## 2023-11-14 MED ORDER — TRIAMCINOLONE ACETONIDE 40 MG/ML IJ SUSP (RADIOLOGY)
60.0000 mg | Freq: Once | INTRAMUSCULAR | Status: AC
  Administered 2023-11-14: 60 mg via EPIDURAL

## 2023-11-14 MED ORDER — IOPAMIDOL (ISOVUE-M 300) INJECTION 61%
1.0000 mL | Freq: Once | INTRAMUSCULAR | Status: AC | PRN
  Administered 2023-11-14: 1 mL via EPIDURAL

## 2023-11-15 NOTE — Telephone Encounter (Signed)
VOB request submitted for 2025.

## 2023-11-28 ENCOUNTER — Other Ambulatory Visit: Payer: 59

## 2023-12-03 NOTE — Telephone Encounter (Signed)
 Medical Buy and Bill  Prior Authorization REQUIRED    PA PROCESS DETAILS: Please ensure your patient meets medical necessity by  reviewing Medical Policy [phone] at [email or web address], and obtaining a pre determination by contacting the PA dept at [phone].  A letter of medical  necessity along with clinical information may be faxed to [phone].

## 2023-12-03 NOTE — Telephone Encounter (Signed)
NEEDS COPAY ENROLLMENT   APPROVED PA# [id number] Valid: 02/22/23-02/22/24

## 2023-12-06 NOTE — Telephone Encounter (Signed)
 Medical Buy and Annette Stable  Prior [employer] APPROVED PA# [id number] Valid: 12/06/23-12/05/24

## 2023-12-06 NOTE — Telephone Encounter (Signed)
 Evenity  Injection Schedule Inj #1 - 03/19/23 Inj #2 - 04/23/23 Inj #3 - 05/28/23 Inj #4 - 07/02/23 Inj #5 - 08/04/23 Inj #6 - 09/05/23 Inj #7 - 10/13/23 Inj #8 -  11/13/23   Re-run benefits for 2025  Prior Auth renewal APPROVED PA# [id number] Valid: 12/06/23-12/05/24   Inj #9 - scheduled 12/16/23 Inj #10 - scheduled 01/20/24 Inj #11  Inj #12

## 2023-12-10 ENCOUNTER — Other Ambulatory Visit: Payer: Self-pay | Admitting: Family Medicine

## 2023-12-16 ENCOUNTER — Ambulatory Visit: Payer: 59 | Admitting: Family Medicine

## 2023-12-23 NOTE — Telephone Encounter (Signed)
Pay on account with [employer] Copay card

## 2023-12-24 NOTE — Telephone Encounter (Signed)
Spoke with patient today. Received verbal authorization to submit payment to collector using [employer] Copay Card.   Payment submitted. Sent a copy of confirmation and Co-pay card to pt.

## 2023-12-25 ENCOUNTER — Ambulatory Visit: Payer: 59 | Admitting: Family Medicine

## 2024-01-06 ENCOUNTER — Ambulatory Visit: Payer: 59 | Admitting: Family Medicine

## 2024-01-06 DIAGNOSIS — M810 Age-related osteoporosis without current pathological fracture: Secondary | ICD-10-CM

## 2024-01-06 MED ORDER — ROMOSOZUMAB-AQQG 105 MG/1.17ML ~~LOC~~ SOSY
210.0000 mg | PREFILLED_SYRINGE | Freq: Once | SUBCUTANEOUS | Status: AC
Start: 2024-01-06 — End: 2024-01-06
  Administered 2024-01-06: 210 mg via SUBCUTANEOUS

## 2024-01-06 NOTE — Progress Notes (Unsigned)
Patient is here for evenity injection #9. Patient received bilateral arm [place] evenity injections today. She tolerated injections well. She will return in 1 month for her next evenity injection.

## 2024-01-20 ENCOUNTER — Ambulatory Visit: Payer: 59 | Admitting: Family Medicine

## 2024-01-21 NOTE — Telephone Encounter (Addendum)
 Evenity Injection Schedule Inj #1 - 03/19/23 Inj #2 - 04/23/23 Inj #3 - 05/28/23 Inj #4 - 07/02/23 Inj #5 - 08/04/23 Inj #6 - 09/05/23 Inj #7 - 10/13/23 Inj #8 -  11/13/23   Re-run benefits for 2025  Prior Auth renewal APPROVED PA# [id number] Valid: 12/06/23-12/05/24   Inj #9 - 01/06/24 Inj #10 - scheduled 02/10/24 Inj #11  Inj #12

## 2024-01-27 ENCOUNTER — Ambulatory Visit: Payer: 59 | Admitting: Family Medicine

## 2024-02-10 ENCOUNTER — Ambulatory Visit: Payer: 59 | Admitting: Family Medicine

## 2024-02-11 ENCOUNTER — Ambulatory Visit: Admitting: Family Medicine

## 2024-02-11 DIAGNOSIS — M810 Age-related osteoporosis without current pathological fracture: Secondary | ICD-10-CM | POA: Diagnosis not present

## 2024-02-11 MED ORDER — ROMOSOZUMAB-AQQG 105 MG/1.17ML ~~LOC~~ SOSY
210.0000 mg | PREFILLED_SYRINGE | Freq: Once | SUBCUTANEOUS | Status: AC
  Administered 2024-02-11: 210 mg via SUBCUTANEOUS

## 2024-02-11 NOTE — Progress Notes (Unsigned)
 Patient is here for evenity injection #10. Patient received bilateral arm [place] evenity injections today. She tolerated injections well. She will return in 1 month for her next evenity injection

## 2024-03-03 NOTE — Telephone Encounter (Signed)
 NEW INSURANCE CARD EFFECTIVE 02/24/2024  Initial Coverage Date 02/24/2024 Patient ID [id number] Plan Type Choice [facility] Open Access Plus Account # [id number] Network [id number]

## 2024-03-15 ENCOUNTER — Ambulatory Visit: Admitting: Family Medicine

## 2024-03-16 NOTE — Telephone Encounter (Addendum)
 Evenity  is DENIED.   Out-of-pocket cost due at time of visit: $254.37   ($25 if using [employer] copay card)  Primary: [employer] Evenity  co-insurance: 10% (approximately $241.37  Admin fee co-insurance: 10% (approximately $13)  Deductible: $4800 ($0 met)  Prior Auth: DENIED  [employer] pa phone #: [phone] Initial Coverage Date: 02/24/2024 Patient ID:  [id number] Clinicals faxed to PA dept @ [phone] 03/25/24- Completed [employer] PA form and clinicals faxed to [phone] 03/29/24- Clinicals faxed again to [phone]- left mess informing pt. 04/06/24- rec'd letter from [employer] stating Evenity  PA is denied. Appeal letter faxed to [phone].    ** This summary of benefits is an estimation of the patient's out-of-pocket cost. Exact cost may vary based on individual plan coverage.

## 2024-03-19 ENCOUNTER — Ambulatory Visit: Payer: Self-pay | Admitting: Pain Medicine

## 2024-04-06 ENCOUNTER — Encounter: Payer: Self-pay | Admitting: *Deleted

## 2024-04-22 ENCOUNTER — Ambulatory Visit: Admitting: Family Medicine

## 2024-04-22 DIAGNOSIS — M810 Age-related osteoporosis without current pathological fracture: Secondary | ICD-10-CM | POA: Diagnosis not present

## 2024-04-22 MED ORDER — ROMOSOZUMAB-AQQG 105 MG/1.17ML ~~LOC~~ SOSY
210.0000 mg | PREFILLED_SYRINGE | Freq: Once | SUBCUTANEOUS | Status: AC
  Administered 2024-04-22: 210 mg via SUBCUTANEOUS

## 2024-04-22 NOTE — Telephone Encounter (Signed)
 04/22/24 Evenity  PA submitted via CoverMyMeds  Approved today by [facility] 2017 [id number] Status:Approved Review Type:Prior Auth; Coverage Start Date:03/23/2024; Coverage End Date:04/22/2025;

## 2024-04-22 NOTE — Progress Notes (Unsigned)
 Patient is here for evenity injection #11. Patient received bilateral arm [place] evenity injections today. She tolerated injections well. She will return in 1 month for her next evenity injection.

## 2024-04-29 ENCOUNTER — Other Ambulatory Visit: Payer: Self-pay | Admitting: *Deleted

## 2024-04-29 MED ORDER — ROMOSOZUMAB-AQQG 105 MG/1.17ML ~~LOC~~ SOSY: 210.0000 mg | PREFILLED_SYRINGE | Freq: Once | SUBCUTANEOUS | 0 refills | Status: AC

## 2024-04-29 NOTE — Telephone Encounter (Signed)
 I just received a call from [place] with [employer]. She provided me with a retro auth for the 04/22/24 Evenity  injection.  Auth #: [id number]   That has been updated with [place] and in chart. Tanis Fan also states the best way for patient to get her last Evenity  injection is to have a prescription sent to [facility]. [facility] will then contact patient regarding payment/out of pocket, if any. Tanis Fan is also aware that patient is utilizing the [employer] Copay assistance program.  Patient's last Evenity  will be shipped to our [address] office for administration for her 06/02/24 visit. Patient informed of this via email.

## 2024-06-02 ENCOUNTER — Ambulatory Visit: Admitting: Family Medicine

## 2024-06-07 ENCOUNTER — Ambulatory Visit (INDEPENDENT_AMBULATORY_CARE_PROVIDER_SITE_OTHER): Admitting: Family Medicine

## 2024-06-07 DIAGNOSIS — M810 Age-related osteoporosis without current pathological fracture: Secondary | ICD-10-CM | POA: Diagnosis not present

## 2024-06-07 MED ORDER — ROMOSOZUMAB-AQQG 105 MG/1.17ML ~~LOC~~ SOSY
210.0000 mg | PREFILLED_SYRINGE | Freq: Once | SUBCUTANEOUS | Status: AC
  Administered 2024-06-07: 210 mg via SUBCUTANEOUS

## 2024-06-07 NOTE — Patient Instructions (Signed)
 Call to sched your bone density tesing [facility] at [facility] Address: [address], [place], [place] [address] Phone: [phone]

## 2024-06-07 NOTE — Progress Notes (Signed)
 Patient is here for evenity  injection #12. This injection was supplied by the patient and sent to us  by [facility]. Patient received bilateral arm [place] evenity  injections today. She tolerated injections well. She will return in 1 month to see Dr Cleatrice to discuss next steps and treats.  In the meantime, she will sched a bone density at [facility] since the breast center is not performing them any longer.

## 2024-06-20 ENCOUNTER — Other Ambulatory Visit: Payer: Self-pay | Admitting: Family Medicine

## 2024-07-12 ENCOUNTER — Ambulatory Visit: Admitting: Family Medicine

## 2024-09-27 ENCOUNTER — Telehealth: Admitting: Family Medicine

## 2024-09-27 DIAGNOSIS — M810 Age-related osteoporosis without current pathological fracture: Secondary | ICD-10-CM

## 2024-09-27 NOTE — Progress Notes (Signed)
 Connected with patient via phone (unable to connect virtually) to discuss her recent bone density test results and next steps.  She has recently moved to [place] so in office visit not possible.  She completed evenity  and had a Dexa with the following results/T scores: Spine -1.3 (improved from -2.4) L femoral neck -1.8 (no comparison) R femoral neck -2.6 (from -2.8)  We discussed pros/cons of Prolia/Jubbonti.  She did not tolerate fosamax due to severe reflux.  We are going to check into who in [place] could administer/prescribe Prolia/Jubbonti and get back to her.
